# Patient Record
Sex: Female | Born: 1970 | Race: White | Hispanic: No | Smoking: Former smoker
Health system: Southern US, Community
[De-identification: ages and names within clinical notes are randomized; demographics above are authoritative.]

## PROBLEM LIST (undated history)

## (undated) DIAGNOSIS — T7840XA Allergy, unspecified, initial encounter: Secondary | ICD-10-CM

## (undated) DIAGNOSIS — K219 Gastro-esophageal reflux disease without esophagitis: Secondary | ICD-10-CM

## (undated) DIAGNOSIS — Z5189 Encounter for other specified aftercare: Secondary | ICD-10-CM

## (undated) DIAGNOSIS — K635 Polyp of colon: Secondary | ICD-10-CM

## (undated) DIAGNOSIS — E785 Hyperlipidemia, unspecified: Secondary | ICD-10-CM

## (undated) DIAGNOSIS — F419 Anxiety disorder, unspecified: Secondary | ICD-10-CM

## (undated) DIAGNOSIS — E079 Disorder of thyroid, unspecified: Secondary | ICD-10-CM

## (undated) DIAGNOSIS — J45909 Unspecified asthma, uncomplicated: Secondary | ICD-10-CM

## (undated) DIAGNOSIS — M1990 Unspecified osteoarthritis, unspecified site: Secondary | ICD-10-CM

## (undated) DIAGNOSIS — N390 Urinary tract infection, site not specified: Secondary | ICD-10-CM

## (undated) HISTORY — DX: Hyperlipidemia, unspecified: E78.5

## (undated) HISTORY — DX: Encounter for other specified aftercare: Z51.89

## (undated) HISTORY — PX: ABDOMINAL SURGERY: SHX537

## (undated) HISTORY — PX: COLONOSCOPY: SHX174

## (undated) HISTORY — DX: Unspecified osteoarthritis, unspecified site: M19.90

## (undated) HISTORY — PX: TUBAL LIGATION: SHX77

## (undated) HISTORY — DX: Anxiety disorder, unspecified: F41.9

## (undated) HISTORY — PX: BACK SURGERY: SHX140

## (undated) HISTORY — DX: Gastro-esophageal reflux disease without esophagitis: K21.9

## (undated) HISTORY — PX: APPENDECTOMY: SHX54

## (undated) HISTORY — DX: Urinary tract infection, site not specified: N39.0

## (undated) HISTORY — PX: KNEE ARTHROCENTESIS: SUR44

## (undated) HISTORY — PX: CHOLECYSTECTOMY: SHX55

## (undated) HISTORY — DX: Polyp of colon: K63.5

## (undated) HISTORY — PX: HERNIA REPAIR: SHX51

## (undated) HISTORY — DX: Disorder of thyroid, unspecified: E07.9

## (undated) HISTORY — DX: Allergy, unspecified, initial encounter: T78.40XA

## (undated) HISTORY — PX: GANGLION CYST EXCISION: SHX1691

---

## 2000-01-26 ENCOUNTER — Other Ambulatory Visit: Admission: RE | Admit: 2000-01-26 | Discharge: 2000-01-26 | Payer: Self-pay | Admitting: Family Medicine

## 2000-03-22 ENCOUNTER — Ambulatory Visit (HOSPITAL_COMMUNITY): Admission: RE | Admit: 2000-03-22 | Discharge: 2000-03-22 | Payer: Self-pay | Admitting: Family Medicine

## 2000-03-22 ENCOUNTER — Encounter: Payer: Self-pay | Admitting: Family Medicine

## 2002-09-08 ENCOUNTER — Other Ambulatory Visit: Admission: RE | Admit: 2002-09-08 | Discharge: 2002-09-08 | Payer: Self-pay | Admitting: *Deleted

## 2004-11-02 ENCOUNTER — Emergency Department (HOSPITAL_COMMUNITY): Admission: EM | Admit: 2004-11-02 | Discharge: 2004-11-02 | Payer: Self-pay | Admitting: Emergency Medicine

## 2004-11-03 ENCOUNTER — Emergency Department (HOSPITAL_COMMUNITY): Admission: EM | Admit: 2004-11-03 | Discharge: 2004-11-03 | Payer: Self-pay | Admitting: Emergency Medicine

## 2005-01-19 ENCOUNTER — Encounter: Admission: RE | Admit: 2005-01-19 | Discharge: 2005-01-19 | Payer: Self-pay | Admitting: Orthopedic Surgery

## 2005-03-07 ENCOUNTER — Encounter (INDEPENDENT_AMBULATORY_CARE_PROVIDER_SITE_OTHER): Payer: Self-pay | Admitting: *Deleted

## 2005-03-07 ENCOUNTER — Inpatient Hospital Stay (HOSPITAL_COMMUNITY): Admission: RE | Admit: 2005-03-07 | Discharge: 2005-03-11 | Payer: Self-pay | Admitting: Orthopedic Surgery

## 2005-03-21 ENCOUNTER — Inpatient Hospital Stay (HOSPITAL_COMMUNITY): Admission: AD | Admit: 2005-03-21 | Discharge: 2005-03-23 | Payer: Self-pay | Admitting: Orthopedic Surgery

## 2009-05-18 ENCOUNTER — Emergency Department (HOSPITAL_COMMUNITY): Admission: EM | Admit: 2009-05-18 | Discharge: 2009-05-19 | Payer: Self-pay | Admitting: Emergency Medicine

## 2010-08-28 ENCOUNTER — Encounter: Payer: Self-pay | Admitting: Family Medicine

## 2010-12-23 NOTE — Op Note (Signed)
[name], Richardson NO.:  [id number]   MEDICAL RECORD NO.:  [id number]          PATIENT TYPE:  INP   LOCATION:  5040                         FACILITY:  [facility]   PHYSICIAN:  Nelda Severe, MD      DATE OF BIRTH:  12/07/1970   DATE OF PROCEDURE:  03/07/2005  DATE OF DISCHARGE:                                 OPERATIVE REPORT   SURGEON:  Nelda Severe, M.D.   ASSISTANT:  Lynford Citizen R.N.   PREOPERATIVE DIAGNOSIS:  Annular tear, L5-S1 disk.   POSTOPERATIVE DIAGNOSIS:  Annular tear, L5-S1 disk.   OPERATIVE PROCEDURE:  Posterior and anterior L5-S1 fusion using posterior  screws and rods Marcelle Smiling), autogenous bone graft, beta-tricalcium phosphate  (VITOSS), anterior body cage (Signus), left anterior iliac crest bone graft  harvest.   DESCRIPTION OF PROCEDURE:  The patient was placed under general endotracheal  anesthesia.  She was positioned prone on the [place] frame.  Care was taken  to position the upper extremities so as to avoid hyperflexion and abduction  of the shoulders and hyperflexion the elbows.  The arms were padded from  maxilla to hands with foam.  The cubital tunnels were under no pressure.  Prior to positioning the neurological monitoring, the technicians had  attached electrodes to patient's scalp, upper extremities and lower  extremities.  A Foley catheter had been placed in the bladder.   The lumbar area was prepped with DuraPrep and draped in rectangular fashion.  The drapes were secured with Ioban.   A midline incision was made at the lumbosacral level.  Cross-table lateral  radiograph was taken with a Kocher on the L5 spinous process, verifying our  impression of last mobile segment.  Paraspinal muscles were reflected  bilaterally using Cobb elevators and cutting current to a point lateral to  the L5-S1 apophyseal joint and to the base of the transverse process of L5  bilaterally.   Soft tissue was meticulously curetted away from the  lamina of S1 and L5 and  from the apophyseal joints.   Next, pedicle holes were made at S1 bilaterally and L5 bilaterally.  This  was done using the usual technique of removing the base of the superior  articular process, identifying the posterior end of the pedicle, perforating  it with an awl, and using a 3.5-mm drill bit to make a hole through the  pedicle into the vertebral body.  Each hole was carefully palpated  circumferentially to make sure it was intact and sounded for depths and the  depths recorded.  The hole was then injected with FloSeal and a radiopaque  marker placed and sealed with bone wax.  Cross-table lateral radiograph  showed satisfactory position of pins, except for the left S1, where the pin  appeared to be immediately subjacent to the endplate.  Therefore, at the  time of screw insertion, the screw was directed slightly distally.   We then harvested local bone graft by removing shavings of bone with a sharp  osteotome from the lamina of L5 and also excising most of the inferior  articular process  of L5.  We then removed the articular surfaces with a  sharp osteotome from the S1 superior articular process.   An 18-gauge needle was then inserted into the left iliac crest and  approximately 10-12 mL of bone marrow aspirated.  This was mixed with 10 mL  of beta-tricalcium phosphate (VITOSS).  The VITOSS and bone marrow were then  mixed with the bone graft which had been removed locally.  The lamina of S1  was also decorticated with a sharp osteotome.   Next, screws were inserted bilaterally at S1 and L5.  These were 6.5-mm-  diameter screws.  These lengths were placed on our depth measurements.   We then packed our graft mixed with VITOSS and bone marrow into the area of  the excised facet joints bilaterally and between the lamina of L5 and S1  bilaterally.  The 45-mm rods, pre-contoured, were then attached to the  screws and the attachments torqued after  compressing the construct.  Cross-  table lateral radiographs showed satisfactory position of the implants.   The wound was then closed using continuous #1 Vicryl in the lumbar fascia,  interrupted and continuous 2-0 Vicryl in the subcutaneous tissue over a 1/8-  inch Hemovac drain and 4-0 subcuticular Vicryl (undyed) through the skin  incision.  The incision unfortunately had to be made directly through a  fairly large and elaborate tattoo on the lumbar area.  Every effort was made  to reapproximate skin edges exactly.  The skin edges were reinforced with  Steri-Strips and an antibiotic ointment dressing applied and secured with  OpSite.  Prior to applying the dressing, the 1/8-inch Hemovac drain was  secured with 2-0 nylon suture in basket-weave fashion.  For application of  the dressing, the patient was then placed supine on a Jackson table which  had been moved into the room and the [place] frame removed.  The patient was  positioned supine with her arms abducted on arm boards and a pillow under  the knees and pillow padding the heels.   The abdomen was prepped with for DuraPrep and draped in a square fashion and  the drapes secured with Ioban.  The prepping included the left anterior  iliac crest area.   Prior to starting the fusion, we harvested a cancellous graft from the left  anterior iliac crest.  The incision was made about 1.5 cm distal to the  crest and the skin pulled proximally.  Incision was carried down onto the  top of the iliac crest and the fascia mobilized medially and laterally to  expose the top of the crest.  An osteotome was then used to shave off some  of the cortical bone and then a small osteotome used to fenestrate what  remained of the superior cortex.  A curette was then used to remove  cancellous bone from between the outer and inner tables.  When enough bone  had been harvested, Gelfoam was packed into the bony defect.   Next, we started the anterior  operation.  A transverse incision was made in  the left lower quadrant about halfway between the umbilicus and symphysis  pubis to the left side.  The underlying rectus sheath was incised  transversely.  The lateral and medial edges of the rectus were identified.  The retroperitoneal space was entered by bluntly dissecting through  preperitoneal fat into the retroperitoneum with the rectus abdominis muscle  retracted to the right side.  Blunt dissection was carried out.  There were  some scarring in the preperitoneal area.  One tear occurred in the  peritoneum which was immediately closed.  The retroperitoneal fat was  bluntly dissected off the common iliac vessels and the ureter retracted with  the peritoneal sac to the right side.  The sacral promontory could be  palpated.  There was a fair amount of scarring overlying the sacral  promontory.  Retractors were provisionally placed and the presacral scar  tissues dissected sharply to the annulus of the disk.  Once in the plane of  the annulus, mobilization of the common iliac vessels was carried out  bilaterally.  Brau retractors on a Thompson frame were used to retract the  iliac vessels.  The peritoneal contents were retracted superiorly using a  right-angle Thompson retractor and a malleable handheld retractor used to  retract the pelvic contents anteriorly away from the front of the sacrum.   The annulus was then incised in rectangular fashion.  A Cobb elevator was  then used to elevate the annulus and nucleus off the superior endplate of S1  and inferior endplate of L5 and the circumferential fibers laterally lysed  with the Cobb elevator.  Approximately 50% to 60% of the disk was removed in  1 piece.   We then vigorously curetted the endplates to remove the endplate cartilage  and curetted the disk posteriorly and laterally to remove more disk tissue.  Eventually, a broad chisel was used to remove a sliver of end plate off the   superior aspect of S1.  This technique was also used on the inferior aspect  of L5.  Eventually, I felt that we had prepared the endplates adequately  insofar as there was no endplate cartilage in the area of the fusion bed and  there was bleeding cancellus bone for contact with the graft below and  above.   After use of a trial implant, a 12-mm, 10-degree-angled Signus anterior  interbody implant was chosen.  Its chambers were filled with autograft prior  to impacting.  It  was then impacted into place and fit well.  A 30 x 6.5-mm  titanium Synthes screw with washer was then inserted into the upper sacrum  so that the washer impinged against the interbody implant to prevent kick-  out.  This is a so-called doorstop screw.   The retractors were then carefully removed, on at a time to make sure that  no undetected bleeding was obscured.  There was no significant bleeding as they were removed.   A cross-table lateral radiograph was taken which was completely  interpretable.  Subsequently, another one was taken, and apparently better  quality could not be obtained.  However, I could see the interbody implant  and the titanium screw.  Another radiograph will be taken postoperatively  once the patient is more mobile.   The rectus sheath was then closed using continuous 0 Vicryl suture.  The  subcutaneous tissue was closed using 2-0 Vicryl in continuous fashion.  The  skin was closed using a subcuticular 3-0 undyed Vicryl suture.  The muscles  fascia, which had not really been detached from medial or lateral wall of  the pelvis, was reapposed over the fenestration in the upper iliac crest  using interrupted 2-0 Vicryl suture.  The subcutaneous tissue was closed  using 2-0 Vicryl and the skin using a subcuticular continuous undyed 3-0  Vicryl suture.  The skin edges of the bone graft harvest site and the  abdomen were reinforced using Steri-Strips.  Antibiotic  dressings were  applied to both  and secured with OpSite.   The patient's blood loss was estimated at 150 mL.  The patient was stable  throughout the procedure.   Not mentioned above is the fact that prior to attaching the rods to the  posterior pedicle screws, each screw was tested and EMGs recorded distally.  The level for stimulation at each screw was above the threshold level for  every screw, indicating that contact between the screw and nerve root was  unlikely.   There were no intraoperative complications.  The sponge and needle counts  were correct.  At the time of dictation, the patient has not awakened  sufficiently to perform a neurologic examination, so none is reported here.  She has not yet been transported to the recovery room.      Nelda Severe, MD  Electronically Signed     MT/MEDQ  D:  03/07/2005  T:  03/08/2005  Job:  [id number]

## 2010-12-23 NOTE — Discharge Summary (Signed)
NAMEDASIAH, HOOLEY NO.:  [id number]   MEDICAL RECORD NO.:  [id number]          PATIENT TYPE:  INP   LOCATION:  5040                         FACILITY:  [facility]   PHYSICIAN:  Nelda Severe, MD      DATE OF BIRTH:  Oct 06, 1970   DATE OF ADMISSION:  03/07/2005  DATE OF DISCHARGE:  03/11/2005                                 DISCHARGE SUMMARY   This woman was admitted for management of diskogenic back pain secondary to  an annular tear at L5-S1. On the day of admission she was taken to the  operating room where anterior and posterior L5-S1 fusion was performed.  There were no complications. Postoperatively the patient mobilized  satisfactorily. The wounds were stable. She is ambulatory. Postoperatively  the patient ambulated well with a walker. She has met her physical therapy  goals. Her dressings are dry.   At the present time she is being discharged. She is being given  prescriptions for Percocet one to two q.6h. p.r.n., 75 tablets, and Colace  100 mg b.i.d. three weeks supply. She is being discharged with a three-in-  one chair and a front-wheeled walker. She is to come back to see me in the  office in four weeks. She is to call me for any problems, especially wound  drainage or fever.   FINAL DIAGNOSIS:  Lumbar disk degeneration with annular tear.   CONDITION ON DISCHARGE:  Ambulatory and stable.   DISCHARGE ARRANGEMENTS:  See above.      Nelda Severe, MD  Electronically Signed     MT/MEDQ  D:  03/11/2005  T:  03/12/2005  Job:  [id number]

## 2010-12-23 NOTE — Discharge Summary (Signed)
NAMEASTOU, Mikayla Richardson NO.:  [id number]   MEDICAL RECORD NO.:  [id number]          PATIENT TYPE:  INP   LOCATION:  5039                         FACILITY:  [facility]   PHYSICIAN:  Nelda Severe, MD      DATE OF BIRTH:  June 06, 1971   DATE OF ADMISSION:  03/21/2005  DATE OF DISCHARGE:  03/23/2005                                 DISCHARGE SUMMARY   HOSPITAL COURSE:  This woman had presented to my office on the day of  admission with the onset of very severe left lower quadrant abdominal pain.  This pain had onset suddenly the evening before.  She was admitted to the  hospital for investigation of the pain.  She was 2 weeks status post  anterior and posterior lumbar fusion which was uncomplicated.  Her abdominal  examination was benign, but she had severe left lower quadrant pain which  appeared to localize more in the region of her incision.  However, there was  no erythema or drainage from the incision.   At the time of admission, she underwent abdominal and pelvic CT with  contrast.  We consulted Dr. Johna Sheriff, general surgeon, and he felt that  there was nothing intraperitoneal that was going on.  The best that he or I  could come up with was that she had some type of event in her incision,  although there was no obvious hematoma, swelling or discharge.   She has remained by in large in bed.  She was on PCA morphine pump.  The  pain is gradually resolving, although is incompletely resolved at the  present time.  She has not shown any systemic symptoms.  She has been  afebrile.  As noted, the pain is decreasing.  Dr. Johna Sheriff saw her today and  signed off on the case for the time-being.   At this time, we are discharging her home.  I have given her a prescription  for Vicodin one to two q.6 h. p.r.n. for pain.  She is to keep her  previously made appointment in the office on April 03, 2005.  She is to  call the office if there are any recurrent problems or any other  problems  for that matter.  She will continue to abide by the back precautions which  she was instructed in prior to her original discharge a little over 2 weeks  ago.   FINAL DIAGNOSIS:  Status post lumbosacral fusion (anterior and posterior)  with abdominal pain, etiology uncertain.   CONDITION ON DISCHARGE:  Pain improving, stable.   DISCHARGE ARRANGEMENTS:  Office followup on April 03, 2005.   DISCHARGE MEDICATIONS:  She has been given a prescription for Vicodin and is  to resume the other medications which she takes including Levoxyl,  albuterol, etc, which she has at home.      Nelda Severe, MD  Electronically Signed     MT/MEDQ  D:  03/23/2005  T:  03/24/2005  Job:  [id number]

## 2012-08-04 ENCOUNTER — Emergency Department (HOSPITAL_COMMUNITY)
Admission: EM | Admit: 2012-08-04 | Discharge: 2012-08-05 | Disposition: A | Discharge status: Home / Self Care | Payer: BC Managed Care – PPO | Attending: Emergency Medicine | Admitting: Emergency Medicine

## 2012-08-04 ENCOUNTER — Emergency Department (HOSPITAL_COMMUNITY): Payer: BC Managed Care – PPO

## 2012-08-04 ENCOUNTER — Encounter (HOSPITAL_COMMUNITY): Payer: Self-pay | Admitting: *Deleted

## 2012-08-04 DIAGNOSIS — F17200 Nicotine dependence, unspecified, uncomplicated: Secondary | ICD-10-CM | POA: Insufficient documentation

## 2012-08-04 DIAGNOSIS — Z79899 Other long term (current) drug therapy: Secondary | ICD-10-CM | POA: Insufficient documentation

## 2012-08-04 DIAGNOSIS — R0789 Other chest pain: Secondary | ICD-10-CM | POA: Insufficient documentation

## 2012-08-04 DIAGNOSIS — J45909 Unspecified asthma, uncomplicated: Secondary | ICD-10-CM | POA: Insufficient documentation

## 2012-08-04 HISTORY — DX: Unspecified asthma, uncomplicated: J45.909

## 2012-08-04 LAB — CBC WITH DIFFERENTIAL/PLATELET
Basophils Absolute: 0.1 10*3/uL (ref 0.0–0.1)
Basophils Relative: 1 % (ref 0–1)
HCT: 38.9 % (ref 36.0–46.0)
Hemoglobin: 13 g/dL (ref 12.0–15.0)
Lymphs Abs: 2.5 10*3/uL (ref 0.7–4.0)
MCHC: 33.4 g/dL (ref 30.0–36.0)
Neutro Abs: 4.4 10*3/uL (ref 1.7–7.7)
Platelets: 288 10*3/uL (ref 150–400)
RBC: 4.4 MIL/uL (ref 3.87–5.11)

## 2012-08-04 LAB — TROPONIN I: Troponin I: <0.3 ng/mL (ref <0.30)

## 2012-08-04 LAB — BASIC METABOLIC PANEL
CO2: 27 mEq/L (ref 19–32)
Creatinine, Ser: 0.75 mg/dL (ref 0.50–1.10)
GFR calc Af Amer: >90 mL/min (ref >90)

## 2012-08-04 LAB — POCT I-STAT TROPONIN I: Troponin i, poc: 0 ng/mL (ref 0.00–0.08)

## 2012-08-04 MED ORDER — IBUPROFEN 800 MG PO TABS: 800.0000 mg | ORAL_TABLET | Freq: Three times a day (TID) | ORAL | Status: DC

## 2012-08-04 NOTE — ED Provider Notes (Signed)
History     CSN: [id number]  Arrival date & time 08/04/12  1946   First MD Initiated Contact with Patient 08/04/12 2153      Chief Complaint  Patient presents with  . Chest Pain    (Consider location/radiation/quality/duration/timing/severity/associated sxs/prior treatment) HPI History provided by pt.   Pt has had intermittent, non-radiating, non-pleuritic, pressure-like pain at LSB for the past 3 nights.  Occurs when she turns her head to the right or ranges her LUE.  No associated sx.  Has not taken anything for pain.  Believes she may have pulled a muscle.  No known trauma. Smokes cigarettes but otherwise no RF for ACS.   Past Medical History  Diagnosis Date  . Asthma     Past Surgical History  Procedure Date  . Back surgery   . Cholecystectomy   . Abdominal surgery   . Hernia repair     History reviewed. No pertinent family history.  History  Substance Use Topics  . Smoking status: Current Every Day Smoker    Types: Cigarettes  . Smokeless tobacco: Not on file  . Alcohol Use: Yes     Comment: rarely    OB History    Grav Para Term Preterm Abortions TAB SAB Ect Mult Living                  Review of Systems  All other systems reviewed and are negative.    Allergies  Review of patient's allergies indicates no known allergies.  Home Medications   Current Outpatient Rx  Name  Route  Sig  Dispense  Refill  . ALBUTEROL SULFATE HFA 108 (90 BASE) MCG/ACT IN AERS   Inhalation   Inhale 2 puffs into the lungs every 6 (six) hours as needed. Wheezing/sob         . FLUTICASONE-SALMETEROL 250-50 MCG/DOSE IN AEPB   Inhalation   Inhale 1 puff into the lungs every 12 (twelve) hours.         Marland Kitchen LEVOTHYROXINE SODIUM 88 MCG PO TABS   Oral   Take 88 mcg by mouth every morning.         Marland Kitchen MONTELUKAST SODIUM 10 MG PO TABS   Oral   Take 10 mg by mouth at bedtime.           BP 130/85  Pulse 93  Temp 97.1 F (36.2 C) (Oral)  Resp 18  SpO2 100%  LMP  06/04/2012  Physical Exam  Nursing note and vitals reviewed. Constitutional: She is oriented to person, place, and time. She appears well-developed and well-nourished. No distress.  HENT:  Head: Normocephalic and atraumatic.  Eyes:       Normal appearance  Neck: Normal range of motion.  Cardiovascular: Normal rate and regular rhythm.   Pulmonary/Chest: Effort normal and breath sounds normal. No respiratory distress.       Tenderness at LSB and pain aggravated by abduction of left shoulder.   Musculoskeletal: Normal range of motion.       No LE edema/ttp  Neurological: She is alert and oriented to person, place, and time.  Skin: Skin is warm and dry. No rash noted.  Psychiatric: She has a normal mood and affect. Her behavior is normal.    ED Course  Procedures (including critical care time)   Date: 08/04/2012  Rate: 81  Rhythm: normal sinus rhythm  QRS Axis: normal  Intervals: normal  ST/T Wave abnormalities: normal  Conduction Disutrbances:none  Narrative Interpretation:   Old  EKG Reviewed: unchanged   Labs Reviewed  CBC WITH DIFFERENTIAL - Abnormal; Notable for the following:    Eosinophils Relative 7 (*)     All other components within normal limits  BASIC METABOLIC PANEL  TROPONIN I  POCT I-STAT TROPONIN I  LAB REPORT - SCANNED   Dg Chest 2 View  08/04/2012  *RADIOLOGY REPORT*  Clinical Data: Chest pain.  CHEST - 2 VIEW  Comparison: 03/09/2005.  Findings: Trachea is midline.  Heart size normal.  Lungs are clear. No pleural fluid.  IMPRESSION: No acute findings.   Original Report Authenticated By: Leanna Battles, M.D.      1. Musculoskeletal chest pain       MDM  41yo F smoker, otherwise healthy, presents w/ 3 days of intermittent CP that occurs with head rotation and ROM of LUE.  Doubt ACS; pain atypical, low risk, EKG non-ischemic and serial troponins neg.  Doubt PE; pain non-pleuritic, no signs of DVT and HR/RR w/in nml range.  CXR neg.  S/sx most  consistent w/ muscle strain or costochondritis.  Pt has been reassured.  I recommended ice and NSAID.  Dr. Rhunette Croft in agreement w/ A&P.  Return precautions discussed.        Otilio Miu, PA-C 08/05/12 [phone]

## 2012-08-04 NOTE — ED Notes (Signed)
Pt c/o L/central CP starting on Thurs afternoon-evening. Pt describes pain as squeezing and intermittent. Pt states pain is persistent, describes back pain and tightness which she says is unchanged from normal discomfort related to chronic asthma and back pain. Pt is PWD, not diaphoretic but describes intermittent clamminess. Pt in no obvious distress at this time.

## 2012-08-04 NOTE — ED Notes (Signed)
Per Florentina Addison PA  Pt doesn't need an IV

## 2012-08-09 NOTE — ED Provider Notes (Signed)
Medical screening examination/treatment/procedure(s) were conducted as a shared visit with non-physician practitioner(s) and myself.  I personally evaluated the patient during the encounter.  Atypical chest pain that is reproducible in a young woman with CAD risk factor - smoking. She is young, and menstruating, which nullifies even the single risk factor she has. Trops x 2 ordered as the ACS r/o.  Derwood Kaplan, MD 08/09/12 [phone]

## 2012-09-04 DIAGNOSIS — J45909 Unspecified asthma, uncomplicated: Secondary | ICD-10-CM | POA: Insufficient documentation

## 2012-09-04 DIAGNOSIS — J4530 Mild persistent asthma, uncomplicated: Secondary | ICD-10-CM | POA: Insufficient documentation

## 2012-09-26 DIAGNOSIS — J301 Allergic rhinitis due to pollen: Secondary | ICD-10-CM | POA: Insufficient documentation

## 2012-09-26 DIAGNOSIS — J309 Allergic rhinitis, unspecified: Secondary | ICD-10-CM | POA: Insufficient documentation

## 2013-01-20 DIAGNOSIS — Z800 Family history of malignant neoplasm of digestive organs: Secondary | ICD-10-CM | POA: Insufficient documentation

## 2013-01-20 DIAGNOSIS — F17200 Nicotine dependence, unspecified, uncomplicated: Secondary | ICD-10-CM | POA: Insufficient documentation

## 2013-01-21 DIAGNOSIS — E785 Hyperlipidemia, unspecified: Secondary | ICD-10-CM | POA: Insufficient documentation

## 2013-07-24 DIAGNOSIS — E039 Hypothyroidism, unspecified: Secondary | ICD-10-CM | POA: Insufficient documentation

## 2017-06-02 ENCOUNTER — Emergency Department (HOSPITAL_COMMUNITY): Payer: BLUE CROSS/BLUE SHIELD

## 2017-06-02 ENCOUNTER — Encounter (HOSPITAL_COMMUNITY): Payer: Self-pay | Admitting: Emergency Medicine

## 2017-06-02 ENCOUNTER — Emergency Department (HOSPITAL_COMMUNITY)
Admission: EM | Admit: 2017-06-02 | Discharge: 2017-06-02 | Disposition: A | Discharge status: Home / Self Care | Payer: BLUE CROSS/BLUE SHIELD | Attending: Emergency Medicine | Admitting: Emergency Medicine

## 2017-06-02 DIAGNOSIS — F17210 Nicotine dependence, cigarettes, uncomplicated: Secondary | ICD-10-CM | POA: Diagnosis not present

## 2017-06-02 DIAGNOSIS — M25572 Pain in left ankle and joints of left foot: Secondary | ICD-10-CM | POA: Diagnosis not present

## 2017-06-02 DIAGNOSIS — J45909 Unspecified asthma, uncomplicated: Secondary | ICD-10-CM | POA: Diagnosis not present

## 2017-06-02 DIAGNOSIS — Z79899 Other long term (current) drug therapy: Secondary | ICD-10-CM | POA: Insufficient documentation

## 2017-06-02 MED ORDER — IBUPROFEN 800 MG PO TABS: 800.0000 mg | ORAL_TABLET | Freq: Three times a day (TID) | ORAL | 0 refills | Status: DC | PRN

## 2017-06-02 NOTE — ED Provider Notes (Signed)
[facility] DEPT Provider Note   CSN: [id number] Arrival date & time: 06/02/17  1453     History   Chief Complaint Chief Complaint  Patient presents with  . Ankle Pain    HPI Mikayla Richardson is a 46 y.o. female.  The history is provided by the patient and medical records. No language interpreter was used.  Ankle Pain   Pertinent negatives include no numbness.   Mikayla Richardson is a 46 y.o. female  with a PMH of asthma who presents to the Emergency Department complaining of persistent left medial ankle pain x 4 days. Patient states that she was walking her dog when she rolled her ankle causing acute onset of pain.  She has been taking ibuprofen and icing the area with little improvement.  She denies numbness, tingling.  She has been able to ambulate without assistance since rolling her ankle, however sometimes does walk with a limp. No hx of prior injuries to the left lower extremity. She assumed her pain would be improving by now, but when she awoke this morning, she noted no improvement, therefore came to ED for evaluation.   Past Medical History:  Diagnosis Date  . Asthma     There are no active problems to display for this patient.   Past Surgical History:  Procedure Laterality Date  . ABDOMINAL SURGERY    . BACK SURGERY    . CHOLECYSTECTOMY    . HERNIA REPAIR      OB History    No data available       Home Medications    Prior to Admission medications   Medication Sig Start Date End Date Taking? Authorizing Provider  albuterol (PROVENTIL HFA;VENTOLIN HFA) 108 (90 BASE) MCG/ACT inhaler Inhale 2 puffs into the lungs every 6 (six) hours as needed. Wheezing/sob    [provider]  Fluticasone-Salmeterol (ADVAIR) 250-50 MCG/DOSE AEPB Inhale 1 puff into the lungs every 12 (twelve) hours.    [provider]  ibuprofen (ADVIL,MOTRIN) 800 MG tablet Take 1 tablet (800 mg total) by mouth every 8 (eight) hours as needed for mild  pain or moderate pain. 06/02/17   Jaciel Diem, Ozella Almond, PA-C  levothyroxine (SYNTHROID, LEVOTHROID) 88 MCG tablet Take 88 mcg by mouth every morning.    [provider]  montelukast (SINGULAIR) 10 MG tablet Take 10 mg by mouth at bedtime.    [provider]    Family History No family history on file.  Social History Social History  Substance Use Topics  . Smoking status: Current Every Day Smoker    Types: Cigarettes  . Smokeless tobacco: Not on file  . Alcohol use Yes     Comment: rarely     Allergies   Patient has no known allergies.   Review of Systems Review of Systems  Musculoskeletal: Positive for arthralgias.  Skin: Negative for color change and wound.  Neurological: Negative for weakness and numbness.     Physical Exam Updated Vital Signs BP (!) 124/93 (BP Location: Right Arm)   Pulse 84   Temp 98.1 F (36.7 C) (Oral)   Resp 18   LMP 06/04/2012   SpO2 97%   Physical Exam  Constitutional: She appears well-developed and well-nourished. No distress.  HENT:  Head: Normocephalic and atraumatic.  Neck: Neck supple.  Cardiovascular: Normal rate, regular rhythm and normal heart sounds.   No murmur heard. Pulmonary/Chest: Effort normal and breath sounds normal. No respiratory distress. She has no wheezes. She  has no rales.  Musculoskeletal:  Left ankle with tenderness to palpation just above medial malleolus with mild associated swelling. Full ROM. No erythema, ecchymosis, or deformity appreciated. No break in skin. No pain to fifth metatarsal area or navicular region. Achilles intact. Good pedal pulse and cap refill of toes.Sensation grossly intact.  Neurological: She is alert.  Skin: Skin is warm and dry.  Nursing note and vitals reviewed.    ED Treatments / Results  Labs (all labs ordered are listed, but only abnormal results are displayed) Labs Reviewed - No data to display  EKG  EKG Interpretation None       Radiology Dg Ankle  Complete Left  Result Date: 06/02/2017 CLINICAL DATA:  Twisted left ankle x 5 days ago; pain on medial Malleolus; No previous injury EXAM: LEFT ANKLE COMPLETE - 3+ VIEW COMPARISON:  None. FINDINGS: Osseous alignment is normal. No fracture line or displaced fracture fragment. Ankle mortise is symmetric. Visualized portions of the hindfoot and midfoot appear intact and normally aligned. Adjacent soft tissues are unremarkable. IMPRESSION: Negative. Electronically Signed   By: Franki Cabot M.D.   On: 06/02/2017 16:09    Procedures Procedures (including critical care time)  Medications Ordered in ED Medications - No data to display   Initial Impression / Assessment and Plan / ED Course  I have reviewed the triage vital signs and the nursing notes.  Pertinent labs & imaging results that were available during my care of the patient were reviewed by me and considered in my medical decision making (see chart for details).    Mikayla Richardson is a 46 y.o. female who presents to ED for left ankle pain after rolling ankle 4 days ago. NVI on exam. No open wounds. X-ray negative. Symptomatic home care instructions discussed. Crutches and ace wrap provided in ED. Ortho follow up if no improvement with RICE / NSAID in 1 week. Reasons to return to ER discussed and all questions answered.   Final Clinical Impressions(s) / ED Diagnoses   Final diagnoses:  Acute left ankle pain    New Prescriptions New Prescriptions   IBUPROFEN (ADVIL,MOTRIN) 800 MG TABLET    Take 1 tablet (800 mg total) by mouth every 8 (eight) hours as needed for mild pain or moderate pain.     Illyanna Petillo, Ozella Almond, PA-C 06/02/17 1636    Charlesetta Shanks, MD 06/02/17 [phone]

## 2017-06-02 NOTE — Discharge Instructions (Signed)
Ibuprofen as needed for pain. Ice and alleviate for additional pain relief. If your symptoms persist without improvement in one week, please follow up with the orthopedist listed. You will need to call them to schedule an appointment.     TREATMENT  Rest, ice, elevation, and compression are the basic modes of treatment.    HOME CARE INSTRUCTIONS  Apply ice to the sore area for 15 to 20 minutes, 3 to 4 times per day. Do this while you are awake for the first 2 days. This can be stopped when the swelling goes away. Put the ice in a plastic bag and place a towel between the bag of ice and your skin.  Keep your leg elevated when possible to lessen swelling.  You may take off your ankle stabilizer at night and to take a shower or bath. Wiggle your toes several times per day if you are able.  ACTIVITY:            - Weight bearing as tolerated - if you can do it, do it. If it hurts, stop.             - Exercises should be limited to pain free range of motion            - Can start mobilization by tracing the alphabet with your foot in the air.       SEEK MEDICAL CARE IF:  You have an increase in bruising, swelling, or pain.  Your toes feel cold.  Pain relief is not achieved with medications.  EMERGENCY:: Your toes are numb or blue or you have severe pain.   MAKE SURE YOU:  Understand these instructions.  Will watch your condition.  Will get help right away if you are not doing well or get worse

## 2017-06-02 NOTE — ED Triage Notes (Signed)
Per pt, states she thinks she twisted her left ankle last Tuesday-swelling and pain

## 2017-11-29 ENCOUNTER — Encounter: Payer: Self-pay | Admitting: Nurse Practitioner

## 2017-12-17 ENCOUNTER — Ambulatory Visit (INDEPENDENT_AMBULATORY_CARE_PROVIDER_SITE_OTHER): Payer: BLUE CROSS/BLUE SHIELD | Admitting: Nurse Practitioner

## 2017-12-17 ENCOUNTER — Encounter: Payer: Self-pay | Admitting: Nurse Practitioner

## 2017-12-17 VITALS — BP 104/60 | HR 82 | Ht 64.0 in | Wt 142.5 lb

## 2017-12-17 DIAGNOSIS — R634 Abnormal weight loss: Secondary | ICD-10-CM

## 2017-12-17 DIAGNOSIS — R1310 Dysphagia, unspecified: Secondary | ICD-10-CM | POA: Diagnosis not present

## 2017-12-17 DIAGNOSIS — Z800 Family history of malignant neoplasm of digestive organs: Secondary | ICD-10-CM | POA: Diagnosis not present

## 2017-12-17 DIAGNOSIS — K219 Gastro-esophageal reflux disease without esophagitis: Secondary | ICD-10-CM | POA: Diagnosis not present

## 2017-12-17 NOTE — Progress Notes (Signed)
Reviewed and agree with management plan.  Jaymar Loeber T. Jacques Fife, MD FACG 

## 2017-12-17 NOTE — Patient Instructions (Signed)
If you are age 49 or older, your body mass index should be between 23-30. Your Body mass index is 24.46 kg/m. If this is out of the aforementioned range listed, please consider follow up with your Primary Care Provider.  If you are age 70 or younger, your body mass index should be between 19-25. Your Body mass index is 24.46 kg/m. If this is out of the aformentioned range listed, please consider follow up with your Primary Care Provider.   You have been scheduled for an endoscopy. Please follow written instructions given to you at your visit today. If you use inhalers (even only as needed), please bring them with you on the day of your procedure. Your physician has requested that you go to www.startemmi.com and enter the access code given to you at your visit today. This web site gives a general overview about your procedure. However, you should still follow specific instructions given to you by our office regarding your preparation for the procedure.  Continue Omeprazole.  Thank you for choosing me and [facility].   Mikayla Savoy, NP

## 2017-12-17 NOTE — Progress Notes (Signed)
Chief Complaint: dysphagia  Referring Provider:   Silvano Rusk, ANP     ASSESSMENT AND PLAN;   4.  47 yo female recently relocated back to [place] where she is from.  Patient is referred by PCP for evaluation of dysphagia which started about 5-6 weeks ago and described as progressive.  Doubt malignant process.  Rule out esophageal rings stricture. Possibly dysmotility though abrupt onset seems atypical.  -Given the weight loss will proceed with EGD with possible dilation. The risks and benefits of EGD were discussed and the patient agrees to proceed.   2.  Strong family history of GI malignancies. One brother deceased from gastric cancer at age 63.  Another brother had cancer at age 86.  Patient's father had colon cancer at age 43 -Patient is up-to-date on colonoscopy, last one March 2018.  Will request records.  She had an endoscopy done at the same time for her family history  3.  Personal history: Polyps.  Last colonoscopy done at time of EGD in [place] March 2018. -Patient has relocated to [place].  We can take over her surveillance colonoscopies from here.  Records requested  5. Tobacco abuse,  Given tobacco cessation information  6. GERD, asymptomatic on omeprazole 40 mg daily which she has taken for 40 years  7.  Thyroid nodules  HPI:    She is a 47 year old female originally from [place] but moved to [place] for work.  She has recently relocated back home to [place].  She is referred by PCP for evaluation of dysphagia.  Patient gives a 4-year history of GERD.  She is basically asymptomatic on daily omeprazole.  She also has a strong family history of gastrointestinal malignancies and father and 2 brothers as mentioned above.  Patien gives a 5 to 6-week history of dysphagia.  Feels like almost any solid gets stuck in her esophagus.  She tends to lean forward as a maneuver to get the food down.  She also coughs/strangles sometimes drinking  fluids.  These episodes of dysphasia are occurring at least once a day.  She was trying to lose weight a few weeks ago and lost several pounds then began losing it unintentionally which apparently called her PCPs attention patient says she is lost about 17 pounds recently.  She has no lower GI complaints, bowel movements are normal, no blood in stool.  Past Medical History:  Diagnosis Date  . Anxiety   . Asthma   . Colon polyps   . Thyroid disease   . UTI (urinary tract infection)      Past Surgical History:  Procedure Laterality Date  . ABDOMINAL SURGERY    . APPENDECTOMY    . BACK SURGERY    . CHOLECYSTECTOMY    . HERNIA REPAIR     Family History  Problem Relation Age of Onset  . Heart attack Mother   . Hypertension Mother   . Colon cancer Father   . Diabetes Father   . Asthma Sister   . Stomach cancer Brother   . Stomach cancer Brother    Social History   Tobacco Use  . Smoking status: Current Every Day Smoker    Types: Cigarettes  . Smokeless tobacco: Never Used  . Tobacco comment: 6-7 cigarrettes a day  Substance Use Topics  . Alcohol use: Yes    Comment: rarely  . Drug use: No   Current Outpatient Medications  Medication Sig Dispense Refill  . albuterol (PROVENTIL HFA;VENTOLIN HFA) 108 (  90 BASE) MCG/ACT inhaler Inhale 2 puffs into the lungs every 6 (six) hours as needed. Wheezing/sob    . Fluticasone-Salmeterol (ADVAIR) 250-50 MCG/DOSE AEPB Inhale 1 puff into the lungs every 12 (twelve) hours.    Marland Kitchen ibuprofen (ADVIL,MOTRIN) 800 MG tablet Take 1 tablet (800 mg total) by mouth every 8 (eight) hours as needed for mild pain or moderate pain. 21 tablet 0  . levothyroxine (SYNTHROID, LEVOTHROID) 88 MCG tablet Take 88 mcg by mouth every morning.    . montelukast (SINGULAIR) 10 MG tablet Take 10 mg by mouth at bedtime.    Marland Kitchen omeprazole (PRILOSEC) 40 MG capsule Take 40 mg by mouth daily.     No current facility-administered medications for this visit.    No Known  Allergies   Review of Systems: Positive for allergy, sinus trouble, anxiety, cough, fatigue, shortness of breath, sleeping problems, sore throat, swollen lymph glands, excessive thirst.  All other systems reviewed and negative except where noted in HPI.    Physical Exam:    Wt Readings from Last 3 Encounters:  12/17/17 142 lb 8 oz (64.6 kg)    BP 104/60   Pulse 82   Ht 5\' 4"  (1.626 m)   Wt 142 lb 8 oz (64.6 kg)   LMP 06/04/2012   BMI 24.46 kg/m  Constitutional:  Well-developed, white female in no acute distress. Psychiatric: Normal mood and affect. Behavior is normal. EENT: Pupils normal.  Conjunctivae are normal. No scleral icterus. Neck supple.  Cardiovascular: Normal rate, regular rhythm. No edema Pulmonary/chest: Effort normal and breath sounds normal. No wheezing, rales or rhonchi. Abdominal: Soft, nondistended. Nontender. Bowel sounds active throughout. There are no masses palpable. No hepatomegaly. Neurological: Alert and oriented to person place and time. Skin: Skin is warm and dry. No rashes noted.  Tye Savoy, NP  12/17/2017, 9:08 AM  Cc:  Silvano Rusk, ANP

## 2017-12-20 ENCOUNTER — Other Ambulatory Visit: Payer: Self-pay

## 2017-12-20 ENCOUNTER — Encounter: Payer: Self-pay | Admitting: Gastroenterology

## 2017-12-20 ENCOUNTER — Ambulatory Visit (AMBULATORY_SURGERY_CENTER): Payer: BLUE CROSS/BLUE SHIELD | Admitting: Gastroenterology

## 2017-12-20 VITALS — BP 102/64 | HR 80 | Temp 97.5°F | Resp 13 | Ht 64.0 in | Wt 142.0 lb

## 2017-12-20 DIAGNOSIS — R1310 Dysphagia, unspecified: Secondary | ICD-10-CM | POA: Diagnosis not present

## 2017-12-20 DIAGNOSIS — K319 Disease of stomach and duodenum, unspecified: Secondary | ICD-10-CM

## 2017-12-20 DIAGNOSIS — R634 Abnormal weight loss: Secondary | ICD-10-CM | POA: Diagnosis not present

## 2017-12-20 MED ORDER — OMEPRAZOLE 40 MG PO CPDR: 40.0000 mg | DELAYED_RELEASE_CAPSULE | Freq: Two times a day (BID) | ORAL | 3 refills | Status: DC

## 2017-12-20 MED ORDER — SODIUM CHLORIDE 0.9 % IV SOLN: 500.0000 mL | Freq: Once | INTRAVENOUS | Status: DC

## 2017-12-20 NOTE — Op Note (Signed)
[facility] Patient Name: Mikayla Richardson Procedure Date: 12/20/2017 8:01 AM MRN: [id number] Endoscopist: Ladene Artist , MD Age: 47 Referring MD:  Date of Birth: 08-02-1971 Gender: Female Account #: [id number] Procedure:                Upper GI endoscopy Indications:              Dysphagia, Weight loss, Cough, Throat tightness Medicines:                Monitored Anesthesia Care Procedure:                Pre-Anesthesia Assessment:                           - Prior to the procedure, a History and Physical                            was performed, and patient medications and                            allergies were reviewed. The patient's tolerance of                            previous anesthesia was also reviewed. The risks                            and benefits of the procedure and the sedation                            options and risks were discussed with the patient.                            All questions were answered, and informed consent                            was obtained. Prior Anticoagulants: The patient has                            taken no previous anticoagulant or antiplatelet                            agents. ASA Grade Assessment: II - A patient with                            mild systemic disease. After reviewing the risks                            and benefits, the patient was deemed in                            satisfactory condition to undergo the procedure.                           After obtaining informed consent, the endoscope was  passed under direct vision. Throughout the                            procedure, the patient's blood pressure, pulse, and                            oxygen saturations were monitored continuously. The                            Endoscope was introduced through the mouth, and                            advanced to the second part of duodenum. The upper                            GI  endoscopy was accomplished without difficulty.                            The patient tolerated the procedure well. Scope In: Scope Out: Findings:                 No endoscopic abnormality was evident in the                            esophagus to explain the patient's complaint of                            dysphagia. It was decided, however, to proceed with                            dilation of the entire esophagus. A guidewire was                            placed and the scope was withdrawn. Dilation was                            performed with a Savary dilator with no resistance                            at 17 mm.                           Patchy mildly erythematous mucosa without bleeding                            was found in the entire examined stomach. Biopsies                            were taken with a cold forceps for histology.                           The exam of the stomach was otherwise normal.  The duodenal bulb and second portion of the                            duodenum were normal. Complications:            No immediate complications. Estimated Blood Loss:     Estimated blood loss was minimal. Impression:               - No endoscopic esophageal abnormality to explain                            patient's dysphagia. Esophagus dilated. Dilated.                           - Erythematous mucosa in the stomach. Biopsied.                           - Normal duodenal bulb and second portion of the                            duodenum. Recommendation:           - Patient has a contact number available for                            emergencies. The signs and symptoms of potential                            delayed complications were discussed with the                            patient. Return to normal activities tomorrow.                            Written discharge instructions were provided to the                            patient.                            - Clear liquid diet for 2 hours, then advance as                            tolerated to soft diet today.                           - Antireflux measures.                           - Continue present medications.                           - Increase omeprazole to 40 mg po bid, ac, 3 months                            of refills.                           -  Await pathology results.                           - Refer to an ENT specialist and Pulmonary                            specialist appointments to be scheduled. Ladene Artist, MD 12/20/2017 8:24:39 AM This report has been signed electronically.

## 2017-12-20 NOTE — Progress Notes (Signed)
A and O x3. Report to RN. Tolerated MAC anesthesia well.Teeth unchanged after procedure.

## 2017-12-20 NOTE — Progress Notes (Signed)
Called to room to assist during endoscopic procedure.  Patient ID and intended procedure confirmed with present staff. Received instructions for my participation in the procedure from the performing physician.  

## 2017-12-20 NOTE — Patient Instructions (Signed)
HANDOUTS GIVEN: POST DILATATION DIET, ANTIREFLUX.   YOU HAD AN ENDOSCOPIC PROCEDURE TODAY AT [facility]:   Refer to the procedure report that was given to you for any specific questions about what was found during the examination.  If the procedure report does not answer your questions, please call your gastroenterologist to clarify.  If you requested that your care partner not be given the details of your procedure findings, then the procedure report has been included in a sealed envelope for you to review at your convenience later.  YOU SHOULD EXPECT: Some feelings of bloating in the abdomen. Passage of more gas than usual.  Walking can help get rid of the air that was put into your GI tract during the procedure and reduce the bloating. If you had a lower endoscopy (such as a colonoscopy or flexible sigmoidoscopy) you may notice spotting of blood in your stool or on the toilet paper. If you underwent a bowel prep for your procedure, you may not have a normal bowel movement for a few days.  Please Note:  You might notice some irritation and congestion in your nose or some drainage.  This is from the oxygen used during your procedure.  There is no need for concern and it should clear up in a day or so.  SYMPTOMS TO REPORT IMMEDIATELY:    Following upper endoscopy (EGD)  Vomiting of blood or coffee ground material  New chest pain or pain under the shoulder blades  Painful or persistently difficult swallowing  New shortness of breath  Fever of 100F or higher  Black, tarry-looking stools  For urgent or emergent issues, a gastroenterologist can be reached at any hour by calling [phone].   DIET:  CLEAR LIQUIDS ONLY FOR TWO HOURS, UNTIL 10:30 AM. THEN ADVANCE TO SOFT DIET TODAY.    ACTIVITY:  You should plan to take it easy for the rest of today and you should NOT DRIVE or use heavy machinery until tomorrow (because of the sedation medicines used during the test).     FOLLOW UP: Our staff will call the number listed on your records the next business day following your procedure to check on you and address any questions or concerns that you may have regarding the information given to you following your procedure. If we do not reach you, we will leave a message.  However, if you are feeling well and you are not experiencing any problems, there is no need to return our call.  We will assume that you have returned to your regular daily activities without incident.  If any biopsies were taken you will be contacted by phone or by letter within the next 1-3 weeks.  Please call us at [phone] if you have not heard about the biopsies in 3 weeks.    SIGNATURES/CONFIDENTIALITY: You and/or your care partner have signed paperwork which will be entered into your electronic medical record.  These signatures attest to the fact that that the information above on your After Visit Summary has been reviewed and is understood.  Full responsibility of the confidentiality of this discharge information lies with you and/or your care-partner.

## 2017-12-20 NOTE — Progress Notes (Signed)
Pt feeling tightness in her chest. Per Gaye Pollack CRNA advised to use inhaler. Pt used inhaler and feeling better.

## 2017-12-21 ENCOUNTER — Telehealth: Payer: Self-pay

## 2017-12-21 ENCOUNTER — Telehealth: Payer: Self-pay | Admitting: *Deleted

## 2017-12-21 DIAGNOSIS — R053 Chronic cough: Secondary | ICD-10-CM

## 2017-12-21 DIAGNOSIS — R05 Cough: Secondary | ICD-10-CM

## 2017-12-21 NOTE — Telephone Encounter (Signed)
  Follow up Call-  Call back number 12/20/2017  Post procedure Call Back phone  # [phone]  Permission to leave phone message Yes  Some recent data might be hidden     Patient questions:  Do you have a fever, pain , or abdominal swelling? No. Pain Score  0 *  Have you tolerated food without any problems? Yes.    Have you been able to return to your normal activities? Yes  Do you have any questions about your discharge instructions: Diet   No. Medications  No. Follow up visit  No.  Do you have questions or concerns about your Care? No.  Actions: * If pain score is 4 or above: No action needed, pain <4.

## 2017-12-21 NOTE — Telephone Encounter (Signed)
Per procedure from 12/21/17 patient needs referral to Pulmonary and ENT  Patient has been scheduled for   Dr. Blenda Nicely at [facility] 12/26/17 9:00 Dr. Chase Caller 01/02/18 9:00  Attempted to reach the patient but was unable to leave a message.  I mailed her a letter with appt details. I will continue to try and reach her.

## 2017-12-24 NOTE — Telephone Encounter (Signed)
Unable to leave a message again.  I will continue to try and reach her

## 2017-12-25 NOTE — Telephone Encounter (Signed)
Unable to leave a message. No answer.

## 2017-12-27 ENCOUNTER — Other Ambulatory Visit: Payer: Self-pay | Admitting: Otolaryngology

## 2017-12-27 DIAGNOSIS — R1314 Dysphagia, pharyngoesophageal phase: Secondary | ICD-10-CM

## 2017-12-30 ENCOUNTER — Encounter: Payer: Self-pay | Admitting: Gastroenterology

## 2018-01-02 ENCOUNTER — Encounter: Payer: Self-pay | Admitting: *Deleted

## 2018-01-02 ENCOUNTER — Ambulatory Visit (INDEPENDENT_AMBULATORY_CARE_PROVIDER_SITE_OTHER): Payer: BLUE CROSS/BLUE SHIELD | Admitting: Internal Medicine

## 2018-01-02 ENCOUNTER — Encounter: Payer: Self-pay | Admitting: Internal Medicine

## 2018-01-02 VITALS — BP 124/82 | HR 114 | Ht 64.0 in | Wt 144.0 lb

## 2018-01-02 DIAGNOSIS — R05 Cough: Secondary | ICD-10-CM | POA: Diagnosis not present

## 2018-01-02 DIAGNOSIS — R059 Cough, unspecified: Secondary | ICD-10-CM

## 2018-01-02 DIAGNOSIS — R053 Chronic cough: Secondary | ICD-10-CM

## 2018-01-02 DIAGNOSIS — F17200 Nicotine dependence, unspecified, uncomplicated: Secondary | ICD-10-CM | POA: Diagnosis not present

## 2018-01-02 LAB — NITRIC OXIDE: NITRIC OXIDE: 18

## 2018-01-02 MED ORDER — PREDNISONE 10 MG PO TABS: ORAL_TABLET | ORAL | 0 refills | Status: DC

## 2018-01-02 NOTE — Progress Notes (Signed)
Subjective:    Patient ID: Mikayla Richardson, female    DOB: [birth year], 47 y.o.   MRN: [id number]  PCP Health, [facility]   HPI   IOV 01/02/2018  , Chief Complaint  Patient presents with  . Consult    Referred by Mikayla Richardson due to chronic cough.  Pt states she has trouble breathing and swallowing along with the cough that has been going on about two months.  Pt states she occ will cough up yellow mucus..  Pt has been diagnosed with asthma.    47 year old smoker.  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  She reports a long history of asthma that has largely been under control for the last 4 to 5 years with her only having to take Singulair on a daily basis.  Then approximately 3 or 4 months ago started having nocturnal proximal nocturnal dyspnea with gasping sensation and dysphagia and a sensation of her throat closing in on her.  This would happen more so at night but also in the daytime.  Then approximately 2 months ago had a bronchitis episode and since then has chronic cough that follows a similar pattern particularly at night.  It is associated with chest tightness and wheezing she was prescribed Advair but due to the expense has not taken it.  She is continued with her Singulair and Claritin.  She has seen Mikayla Richardson and GI.  At the time she started seeing her she was already on Prilosec 20 mg daily for a year.  Dec 20, 2017 she underwent endoscopy.  I personally visualized the report and it is normal.  She is status post esophageal dilatation which she says only helped for a few days but since then has not helped.  After the endoscopy her Prilosec has been doubled but the symptoms have remained unchanged particularly the cough.  Therefore referral to pulmonary was made.  She also referral with ENT pending at this stage.  She does continue to smoke.  Laughing and talking make her cough worse.  Cough is severe as described in the RSI cough score.   Drinking water or trying to stay quiet can help her cough.  The exhaled nitric oxide today is normal.  Review of the chart shows in 2013 she had a chest x-ray that I personally visualized and is clear.  She is not taking any fish oil or ACE inhibitors.   Mikayla Richardson Reflux Symptom Index (> 13-15 suggestive of LPR cough) 0 -> 5  =  none ->severe problem  Hoarseness of problem with voice 3  Clearing  Of Throat 4  Excess throat mucus or feeling of post nasal drip 4  Difficulty swallowing food, liquid or tablets 5  Cough after eating or lying down 4  Breathing difficulties or choking episodes 5  Troublesome or annoying cough 5  Sensation of something sticking in throat or lump in throat 4  Heartburn, chest pain, indigestion, or stomach acid coming up 3  TOTAL 37      has a past medical history of Allergy, Anxiety, Asthma, Blood transfusion without reported diagnosis, Colon polyps, GERD (gastroesophageal reflux disease), Thyroid disease, and UTI (urinary tract infection).   reports that she has been smoking cigarettes.  She has a 3.75 pack-year smoking history. She has never used smokeless tobacco.  Past Surgical History:  Procedure Laterality Date  . ABDOMINAL SURGERY    . APPENDECTOMY    . BACK SURGERY    .  CHOLECYSTECTOMY    . GANGLION CYST EXCISION     right wrist 1992  . HERNIA REPAIR      No Known Allergies  Immunization History  Administered Date(s) Administered  . Influenza Split 06/19/2017  . Pneumococcal Polysaccharide-23 01/08/2013    Family History  Problem Relation Age of Onset  . Heart attack Mother   . Hypertension Mother   . Colon cancer Father   . Diabetes Father   . Asthma Sister   . Stomach cancer Brother   . Stomach cancer Brother   . Esophageal cancer Neg Hx   . Rectal cancer Neg Hx      Current Outpatient Medications:  .  albuterol (PROVENTIL HFA;VENTOLIN HFA) 108 (90 BASE) MCG/ACT inhaler, Inhale 2 puffs into the lungs every 6 (six) hours as  needed. Wheezing/sob, Disp: , Rfl:  .  levothyroxine (SYNTHROID, LEVOTHROID) 88 MCG tablet, Take 88 mcg by mouth every morning., Disp: , Rfl:  .  loratadine (CLARITIN) 10 MG tablet, , Disp: , Rfl:  .  montelukast (SINGULAIR) 10 MG tablet, Take 10 mg by mouth at bedtime., Disp: , Rfl:  .  omeprazole (PRILOSEC) 40 MG capsule, Take 1 capsule (40 mg total) by mouth 2 (two) times daily., Disp: 60 capsule, Rfl: 3  Current Facility-Administered Medications:  .  0.9 %  sodium chloride infusion, 500 mL, Intravenous, Once, Mikayla Artist, Mikayla Richardson    Review of Systems  Constitutional: Positive for unexpected weight change. Negative for fever.  HENT: Positive for sinus pressure, sore throat and trouble swallowing. Negative for congestion, dental problem, ear pain, nosebleeds, postnasal drip, rhinorrhea and sneezing.   Eyes: Negative for redness and itching.  Respiratory: Positive for cough, chest tightness, shortness of breath and wheezing.   Cardiovascular: Negative for palpitations and leg swelling.  Gastrointestinal: Negative for nausea and vomiting.  Genitourinary: Negative for dysuria.  Musculoskeletal: Negative for joint swelling.  Skin: Negative for rash.  Allergic/Immunologic: Positive for environmental allergies. Negative for food allergies and immunocompromised state.  Neurological: Positive for headaches.  Hematological: Does not bruise/bleed easily.  Psychiatric/Behavioral: Negative for dysphoric mood. The patient is nervous/anxious.        Objective:   Physical Exam  Constitutional: She is oriented to person, place, and time. She appears well-developed and well-nourished. No distress.  HENT:  Head: Normocephalic and atraumatic.  Right Ear: External ear normal.  Left Ear: External ear normal.  Mouth/Throat: Oropharynx is clear and moist. No oropharyngeal exudate.  Periodic laryngeal cough esp when taking deep breath  Eyes: Pupils are equal, round, and reactive to light. Conjunctivae  and EOM are normal. Right eye exhibits no discharge. Left eye exhibits no discharge. No scleral icterus.  Neck: Normal range of motion. Neck supple. No JVD present. No tracheal deviation present. No thyromegaly present.  Cardiovascular: Normal rate, regular rhythm, normal heart sounds and intact distal pulses. Exam reveals no gallop and no friction rub.  No murmur heard. Pulmonary/Chest: Effort normal and breath sounds normal. No respiratory distress. She has no wheezes. She has no rales. She exhibits no tenderness.  Abdominal: Soft. Bowel sounds are normal. She exhibits no distension and no mass. There is no tenderness. There is no rebound and no guarding.  Musculoskeletal: Normal range of motion. She exhibits no edema or tenderness.  Lymphadenopathy:    She has no cervical adenopathy.  Neurological: She is alert and oriented to person, place, and time. She has normal reflexes. No cranial nerve deficit. She exhibits normal muscle tone. Coordination normal.  Skin: Skin is warm and dry. No rash noted. She is not diaphoretic. No erythema. No pallor.  Psychiatric: She has a normal mood and affect. Her behavior is normal. Judgment and thought content normal.  Vitals reviewed.   Vitals:   01/02/18 0903  BP: 124/82  Pulse: (!) 114  SpO2: 98%  Weight: 144 lb (65.3 kg)  Height: 5\' 4"  (1.626 m)    Estimated body mass index is 24.72 kg/m as calculated from the following:   Height as of this encounter: 5\' 4"  (1.626 m).   Weight as of this encounter: 144 lb (65.3 kg).         Assessment & Plan:     ICD-10-CM   1. Chronic cough R05 Pulmonary function test  2. Tobacco use disorder F17.200   3. Cough R05 CT Chest High Resolution    Given her smoking history I want to rule out any COPD at this stage.  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  She is also a candidate for interstitial lung disease.  I will treat her empirically with  prednisone because she has never had this treatment.  I also encouraged her to do some voice rest drink water and suck on sugarless lozenge.  Meanwhile I will get some high-resolution CT chest and primary function test in the next few weeks and regroup.  If the cough only has a partial response or no response and investigation is noncontributory then will commit her to gabapentin and voice rehab for cough neuropathy.  I have encouraged her to quit smoking.   Mikayla Richardson, M.D., [facility].C.P Pulmonary and Critical Care Medicine Staff Physician, [place] Director - Interstitial Lung Disease  Program  Pulmonary [place] [facility] [place], [place], [address]  Pager: [phone], If no answer or between  15:00h - 7:00h: call [phone] Telephone: [phone]

## 2018-01-02 NOTE — Patient Instructions (Addendum)
ICD-10-CM   1. Chronic cough R05   2. Tobacco use disorder F17.200      Take prednisone 40 mg daily x 2 days, then 20mg  daily x 2 days, then 10mg  daily x 2 days, then 5mg  daily x 2 days and stop  Do 2 days of total voice rest  Continue double dose prilosec  contininue claritin and singulair  Any time there is urge to cough, drink water or suck on sugarless lozenge   Do HRCT anytime next few weeks  Do full PFt anytime next few weeks  Followup In 2-4 weeks with aPP or Dr Chase Caller  After tests - RSI cough score at followup  - if no or partial response only will consider gabapentin +/- voice rehab

## 2018-01-10 ENCOUNTER — Ambulatory Visit (INDEPENDENT_AMBULATORY_CARE_PROVIDER_SITE_OTHER)
Admission: RE | Admit: 2018-01-10 | Discharge: 2018-01-10 | Disposition: A | Discharge status: Home / Self Care | Payer: BLUE CROSS/BLUE SHIELD | Source: Ambulatory Visit | Attending: Internal Medicine | Admitting: Internal Medicine

## 2018-01-10 ENCOUNTER — Ambulatory Visit
Admission: RE | Admit: 2018-01-10 | Discharge: 2018-01-10 | Disposition: A | Discharge status: Home / Self Care | Payer: BLUE CROSS/BLUE SHIELD | Source: Ambulatory Visit | Attending: Otolaryngology | Admitting: Otolaryngology

## 2018-01-10 DIAGNOSIS — R059 Cough, unspecified: Secondary | ICD-10-CM

## 2018-01-10 DIAGNOSIS — R1314 Dysphagia, pharyngoesophageal phase: Secondary | ICD-10-CM

## 2018-01-10 DIAGNOSIS — R05 Cough: Secondary | ICD-10-CM | POA: Diagnosis not present

## 2018-01-21 ENCOUNTER — Telehealth: Payer: Self-pay | Admitting: Internal Medicine

## 2018-01-21 NOTE — Telephone Encounter (Signed)
CT chest is normal but for 62mm  RUL nodule that is unlikely cancer. SHe is having PFT in July . Should see APP for followup at that time   Musculoskeletal:  No aggressive appearing focal osseous lesions.  IMPRESSION: 1. No evidence of interstitial lung disease. 2. Solid 3 mm apical right upper lobe pulmonary nodule. No follow-up needed if patient is low-risk. Non-contrast chest CT can be considered in 12 months if patient is high-risk. This recommendation follows the consensus statement: Guidelines for Management of Incidental Pulmonary Nodules Detected on CT Images: From the Fleischner Society 2017; Radiology 2017; 920:100-712.1.  Aortic Atherosclerosis (ICD10-I70.0).   Electronically Signed   By: Ilona Sorrel M.D.   On: 01/11/2018 09:19

## 2018-01-22 NOTE — Telephone Encounter (Signed)
Pt has a f/u appt with TP and a PFT before 01/23/18.  Called and spoke with pt letting her know the results of the scan and stated TP would go over results in more detail with her at [facility] tomorrow.  Pt expressed understanding. Nothing further needed.

## 2018-01-23 ENCOUNTER — Ambulatory Visit (INDEPENDENT_AMBULATORY_CARE_PROVIDER_SITE_OTHER): Payer: BLUE CROSS/BLUE SHIELD | Admitting: Internal Medicine

## 2018-01-23 ENCOUNTER — Encounter: Payer: Self-pay | Admitting: Adult Health

## 2018-01-23 ENCOUNTER — Ambulatory Visit (INDEPENDENT_AMBULATORY_CARE_PROVIDER_SITE_OTHER): Payer: BLUE CROSS/BLUE SHIELD | Admitting: Adult Health

## 2018-01-23 DIAGNOSIS — F17200 Nicotine dependence, unspecified, uncomplicated: Secondary | ICD-10-CM | POA: Diagnosis not present

## 2018-01-23 DIAGNOSIS — R918 Other nonspecific abnormal finding of lung field: Secondary | ICD-10-CM | POA: Diagnosis not present

## 2018-01-23 DIAGNOSIS — J302 Other seasonal allergic rhinitis: Secondary | ICD-10-CM | POA: Diagnosis not present

## 2018-01-23 DIAGNOSIS — J4530 Mild persistent asthma, uncomplicated: Secondary | ICD-10-CM

## 2018-01-23 DIAGNOSIS — R05 Cough: Secondary | ICD-10-CM | POA: Diagnosis not present

## 2018-01-23 DIAGNOSIS — K219 Gastro-esophageal reflux disease without esophagitis: Secondary | ICD-10-CM | POA: Diagnosis not present

## 2018-01-23 DIAGNOSIS — R911 Solitary pulmonary nodule: Secondary | ICD-10-CM | POA: Diagnosis not present

## 2018-01-23 DIAGNOSIS — R053 Chronic cough: Secondary | ICD-10-CM

## 2018-01-23 LAB — PULMONARY FUNCTION TEST
DL/VA % PRED: 76 %
DL/VA: 3.7 ml/min/mmHg/L
DLCO UNC: 20.01 ml/min/mmHg
DLCO unc % pred: 80 %
FEF 25-75 Post: 3.38 L/sec
FEF 25-75 Pre: 2.27 L/sec
FEF2575-%Change-Post: 48 %
FEF2575-%Pred-Post: 120 %
FEF2575-%Pred-Pre: 80 %
FEV1-%Change-Post: 12 %
FEV1-%PRED-PRE: 91 %
FEV1-%Pred-Post: 103 %
FEV1-PRE: 2.64 L
FEV1-Post: 2.96 L
FEV1FVC-%Change-Post: 4 %
FEV1FVC-%Pred-Pre: 97 %
FEV6-%Change-Post: 6 %
FEV6-%PRED-PRE: 95 %
FEV6-%Pred-Post: 102 %
FEV6-POST: 3.6 L
FEV6-PRE: 3.37 L
FEV6FVC-%PRED-POST: 102 %
FEV6FVC-%Pred-Pre: 102 %
FVC-%Change-Post: 6 %
FVC-%PRED-POST: 99 %
FVC-%PRED-PRE: 93 %
FVC-POST: 3.6 L
FVC-PRE: 3.37 L
POST FEV6/FVC RATIO: 100 %
PRE FEV6/FVC RATIO: 100 %
Post FEV1/FVC ratio: 82 %
Pre FEV1/FVC ratio: 78 %
RV % pred: 149 %
RV: 2.69 L
TLC % PRED: 115 %
TLC: 5.93 L

## 2018-01-23 MED ORDER — BENZONATATE 200 MG PO CAPS: 200.0000 mg | ORAL_CAPSULE | Freq: Three times a day (TID) | ORAL | 1 refills | Status: AC | PRN

## 2018-01-23 MED ORDER — BUDESONIDE-FORMOTEROL FUMARATE 80-4.5 MCG/ACT IN AERO: 2.0000 | INHALATION_SPRAY | Freq: Two times a day (BID) | RESPIRATORY_TRACT | 0 refills | Status: DC

## 2018-01-23 MED ORDER — BUDESONIDE-FORMOTEROL FUMARATE 80-4.5 MCG/ACT IN AERO: 2.0000 | INHALATION_SPRAY | Freq: Two times a day (BID) | RESPIRATORY_TRACT | 3 refills | Status: DC

## 2018-01-23 MED ORDER — ALBUTEROL SULFATE (2.5 MG/3ML) 0.083% IN NEBU: 2.5000 mg | INHALATION_SOLUTION | Freq: Four times a day (QID) | RESPIRATORY_TRACT | 3 refills | Status: DC | PRN

## 2018-01-23 NOTE — Progress Notes (Signed)
PFT done today. 

## 2018-01-23 NOTE — Assessment & Plan Note (Signed)
Control for triggers   Plan  Patient Instructions  Set up CT chest in 1 year to follow lung nodule Begin Symbicort 80 2 puffs twice daily, rinse after use Continue on Claritin 10 mg daily Saline nasal rinses as needed Use sips of water to soothe throat and avoid coughing and throat clearing Avoid mint products Begin Delsym 2 teaspoons twice daily for cough Tessalon Perles 3 times daily as needed for cough Work on not smoking Follow-up with Dr. Chase Caller in 2 months and as needed Please contact office for sooner follow up if symptoms do not improve or worsen or seek emergency care

## 2018-01-23 NOTE — Assessment & Plan Note (Signed)
Smoking cessation  

## 2018-01-23 NOTE — Assessment & Plan Note (Signed)
GERD diet  PPI Twice daily

## 2018-01-23 NOTE — Assessment & Plan Note (Signed)
Mild persistent asthma with upper airway cough most likely with chronic rhinitis and GERD triggers. Trial of ICS/lama.  Control for triggers and cough   Plan  Patient Instructions  Set up CT chest in 1 year to follow lung nodule Begin Symbicort 80 2 puffs twice daily, rinse after use Continue on Claritin 10 mg daily Saline nasal rinses as needed Use sips of water to soothe throat and avoid coughing and throat clearing Avoid mint products Begin Delsym 2 teaspoons twice daily for cough Tessalon Perles 3 times daily as needed for cough Work on not smoking Follow-up with Dr. Chase Caller in 2 months and as needed Please contact office for sooner follow up if symptoms do not improve or worsen or seek emergency care

## 2018-01-23 NOTE — Assessment & Plan Note (Signed)
CT chest June 2019 showed a 3 mm right upper lobe nodule.  Patient is active smoker will need a follow-up CT chest in 1 year

## 2018-01-23 NOTE — Progress Notes (Signed)
@Patient  ID: Mikayla Richardson, female    DOB: [birth year], 47 y.o.   MRN: [id number]  Chief Complaint  Patient presents with  . Follow-up    Cough    Referring provider: [facility], [facility]  HPI: 47 year old female active smoker seen for pulmonary consult Jan 02, 2018 for chronic cough Past medical history significant for asthma and GERD   TEST  Jan 02, 2018 Dr. Terie Purser reflux symptom index-37  01/23/2018 Follow up ; Cough /Asthma  Patient presents for a 3-week follow-up.  Patient was seen last visit for a pulmonary consult for chronic cough.  Patient was given a prednisone taper.  And instructed to control for triggers with Prilosec twice daily.  Voice rest.  She was set up for a high-resolution CT chest that showed no evidence of interstitial lung disease.  3 mm right upper lobe nodule noted.  CT chest in 1 year has been recommended Cough score/reflux symptom index decreased at 29 today. Says she is doing some better.  Cough has decreased but continues to have frequent dry cough that is intermittently productive with mucus.  Has frequent throat clearing and sinus drainage.  She remains on Prilosec twice daily for reflux and does seem to have some improvement since she had a esophageal dilatation. PFTs were done today that show normal lung function with FEV1 103%, ratio 82, FVC 99%, positive bronchodilator response, DLCO 80%   No Known Allergies  Immunization History  Administered Date(s) Administered  . Influenza Split 06/19/2017  . Pneumococcal Polysaccharide-23 01/08/2013    Past Medical History:  Diagnosis Date  . Allergy   . Anxiety   . Asthma   . Blood transfusion without reported diagnosis    1991 after childbirth  . Colon polyps   . GERD (gastroesophageal reflux disease)   . Thyroid disease   . UTI (urinary tract infection)     Tobacco History: Social History   Tobacco Use  Smoking Status Current Every Day Smoker  . Packs/day: 0.25  . Years: 15.00  . Pack  years: 3.75  . Types: Cigarettes  Smokeless Tobacco Never Used   Ready to quit: No Counseling given: Yes   Outpatient Encounter Medications as of 01/23/2018  Medication Sig  . albuterol (PROVENTIL HFA;VENTOLIN HFA) 108 (90 BASE) MCG/ACT inhaler Inhale 2 puffs into the lungs every 6 (six) hours as needed. Wheezing/sob  . albuterol (PROVENTIL) (2.5 MG/3ML) 0.083% nebulizer solution Take 3 mLs (2.5 mg total) by nebulization every 6 (six) hours as needed for wheezing or shortness of breath.  . levothyroxine (SYNTHROID, LEVOTHROID) 100 MCG tablet Take 100 mcg by mouth every morning.   . loratadine (CLARITIN) 10 MG tablet   . montelukast (SINGULAIR) 10 MG tablet Take 10 mg by mouth at bedtime.  Marland Kitchen omeprazole (PRILOSEC) 40 MG capsule Take 1 capsule (40 mg total) by mouth 2 (two) times daily.  . [DISCONTINUED] albuterol (PROVENTIL) (2.5 MG/3ML) 0.083% nebulizer solution Take 2.5 mg by nebulization every 6 (six) hours as needed for wheezing or shortness of breath.  . benzonatate (TESSALON) 200 MG capsule Take 1 capsule (200 mg total) by mouth 3 (three) times daily as needed for cough.  . budesonide-formoterol (SYMBICORT) 80-4.5 MCG/ACT inhaler Inhale 2 puffs into the lungs 2 (two) times daily.  . budesonide-formoterol (SYMBICORT) 80-4.5 MCG/ACT inhaler Inhale 2 puffs into the lungs 2 (two) times daily.  . [DISCONTINUED] predniSONE (DELTASONE) 10 MG tablet Take 40mg x2days,20mg x2days,10mg x2days,5mg x2days,then stop (Patient not taking: Reported on 01/23/2018)   Facility-Administered Encounter Medications as  of 01/23/2018  Medication  . 0.9 %  sodium chloride infusion     Review of Systems  Constitutional:   No  weight loss, night sweats,  Fevers, chills, fatigue, or  lassitude.  HEENT:   No headaches,  Difficulty swallowing,  Tooth/dental problems, or  Sore throat,                No sneezing, itching, ear ache,  +nasal congestion, post nasal drip,   CV:  No chest pain,  Orthopnea, PND, swelling  in lower extremities, anasarca, dizziness, palpitations, syncope.   GI  No heartburn, indigestion, abdominal pain, nausea, vomiting, diarrhea, change in bowel habits, loss of appetite, bloody stools.   Resp:  .  No chest wall deformity  Skin: no rash or lesions.  GU: no dysuria, change in color of urine, no urgency or frequency.  No flank pain, no hematuria   MS:  No joint pain or swelling.  No decreased range of motion.  No back pain.    Physical Exam  BP 120/62 (BP Location: Left Arm, Cuff Size: Normal)   Pulse 70   Ht 5' 5.5" (1.664 m)   Wt 144 lb (65.3 kg)   LMP 06/04/2012   SpO2 100%   BMI 23.60 kg/m   GEN: A/Ox3; pleasant , NAD,    HEENT:  [place]/AT,  EACs-clear, TMs-wnl, NOSE-clear drainage , THROAT-clear, no lesions, no postnasal drip or exudate noted.   NECK:  Supple w/ fair ROM; no JVD; normal carotid impulses w/o bruits; no thyromegaly or nodules palpated; no lymphadenopathy.    RESP  Clear  P & A; w/o, wheezes/ rales/ or rhonchi. no accessory muscle use, no dullness to percussion  CARD:  RRR, no m/r/g, no peripheral edema, pulses intact, no cyanosis or clubbing.  GI:   Soft & nt; nml bowel sounds; no organomegaly or masses detected.   Musco: Warm bil, no deformities or joint swelling noted.   Neuro: alert, no focal deficits noted.    Skin: Warm, no lesions or rashes    Lab Results:    BNP No results found for: BNP  ProBNP No results found for: PROBNP  Imaging: Dg Esophagus  Result Date: 01/10/2018 CLINICAL DATA:  Pharyngo esophageal dysphagia. Patient reports a history of esophageal dilation for globus sensation in the throat 1 month prior. EXAM: ESOPHOGRAM / BARIUM SWALLOW / BARIUM TABLET STUDY TECHNIQUE: Combined double contrast and single contrast examination performed using effervescent crystals, thick barium liquid, and thin barium liquid. The patient was observed with fluoroscopy swallowing a 13 mm barium sulphate tablet. FLUOROSCOPY TIME:   Fluoroscopy Time:  2 minutes 48 seconds Radiation Exposure Index (if provided by the fluoroscopic device): 39 mGy Number of Acquired Spot Images: 9 COMPARISON:  03/21/2005 CT abdomen/pelvis. FINDINGS: Oral and pharyngeal phases of swallowing are normal, with no laryngeal penetration or tracheobronchial aspiration. No evidence of pharyngeal mass, stricture or diverticulum. No evidence of significant cricopharyngeus muscle dysfunction. No hiatal hernia. Mild gastroesophageal reflux was elicited to the level of midthoracic esophagus with water siphon test. Minimal esophageal dysmotility, characterized by intermittent slight weakening of primary peristalsis in the lower thoracic esophagus. Mild granularity of esophageal mucosa in mid to lower thoracic esophagus suggesting mild reflux esophagitis. No evidence of esophageal mass, stricture or ulcer. Swallowed barium tablet traversed the esophagus into the stomach without delay. IMPRESSION: 1. Normal oral and pharyngeal phase of swallowing, with no laryngeal penetration or tracheobronchial aspiration. 2. No hiatal hernia.  Mild gastroesophageal reflux. 3. Minimal esophageal  dysmotility, characteristic of reflux related dysmotility. 4. Suggestion of mild reflux esophagitis in the mid to lower thoracic esophagus, with no evidence of esophageal mass, stricture or ulcer. Electronically Signed   By: Ilona Sorrel M.D.   On: 01/10/2018 11:27   Ct Chest High Resolution  Result Date: 01/11/2018 CLINICAL DATA:  Persistent cough, chronic for years. EXAM: CT CHEST WITHOUT CONTRAST TECHNIQUE: Multidetector CT imaging of the chest was performed following the standard protocol without intravenous contrast. High resolution imaging of the lungs, as well as inspiratory and expiratory imaging, was performed. COMPARISON:  08/04/2012 chest radiograph. FINDINGS: Cardiovascular: Normal heart size. No significant pericardial effusion/thickening. Atherosclerotic nonaneurysmal thoracic aorta.  Normal caliber pulmonary arteries. Mediastinum/Nodes: No discrete thyroid nodules. Unremarkable esophagus. No pathologically enlarged axillary, mediastinal or hilar lymph nodes, noting limited sensitivity for the detection of hilar adenopathy on this noncontrast study. Lungs/Pleura: No pneumothorax. No pleural effusion. No acute consolidative airspace disease or lung masses. A few scattered tiny calcified granulomas are noted in both lungs. Solid 3 mm apical right upper lobe pulmonary nodule (series 3/image 20). No significant regions of subpleural reticulation, ground-glass attenuation, traction bronchiectasis, parenchymal banding, architectural distortion or frank honeycombing. Upper abdomen: Surgical clips are noted in the right upper quadrant of the abdomen. Musculoskeletal:  No aggressive appearing focal osseous lesions. IMPRESSION: 1. No evidence of interstitial lung disease. 2. Solid 3 mm apical right upper lobe pulmonary nodule. No follow-up needed if patient is low-risk. Non-contrast chest CT can be considered in 12 months if patient is high-risk. This recommendation follows the consensus statement: Guidelines for Management of Incidental Pulmonary Nodules Detected on CT Images: From the Fleischner Society 2017; Radiology 2017; 270:350-093.8. Aortic Atherosclerosis (ICD10-I70.0). Electronically Signed   By: Ilona Sorrel M.D.   On: 01/11/2018 09:19     Assessment & Plan:   Asthma Mild persistent asthma with upper airway cough most likely with chronic rhinitis and GERD triggers. Trial of ICS/lama.  Control for triggers and cough   Plan  Patient Instructions  Set up CT chest in 1 year to follow lung nodule Begin Symbicort 80 2 puffs twice daily, rinse after use Continue on Claritin 10 mg daily Saline nasal rinses as needed Use sips of water to soothe throat and avoid coughing and throat clearing Avoid mint products Begin Delsym 2 teaspoons twice daily for cough Tessalon Perles 3 times daily  as needed for cough Work on not smoking Follow-up with Dr. Chase Caller in 2 months and as needed Please contact office for sooner follow up if symptoms do not improve or worsen or seek emergency care       Allergic rhinitis Control for triggers   Plan  Patient Instructions  Set up CT chest in 1 year to follow lung nodule Begin Symbicort 80 2 puffs twice daily, rinse after use Continue on Claritin 10 mg daily Saline nasal rinses as needed Use sips of water to soothe throat and avoid coughing and throat clearing Avoid mint products Begin Delsym 2 teaspoons twice daily for cough Tessalon Perles 3 times daily as needed for cough Work on not smoking Follow-up with Dr. Chase Caller in 2 months and as needed Please contact office for sooner follow up if symptoms do not improve or worsen or seek emergency care       Tobacco use disorder Smoking cessation   GERD (gastroesophageal reflux disease) GERD diet  PPI Twice daily         Tammy Parrett, NP 01/23/2018

## 2018-01-23 NOTE — Patient Instructions (Addendum)
Set up CT chest in 1 year to follow lung nodule Begin Symbicort 80 2 puffs twice daily, rinse after use Continue on Claritin 10 mg daily Saline nasal rinses as needed Use sips of water to soothe throat and avoid coughing and throat clearing Avoid mint products Begin Delsym 2 teaspoons twice daily for cough Tessalon Perles 3 times daily as needed for cough Work on not smoking Follow-up with Dr. Chase Caller in 2 months and as needed Please contact office for sooner follow up if symptoms do not improve or worsen or seek emergency care

## 2018-03-17 ENCOUNTER — Other Ambulatory Visit: Payer: Self-pay | Admitting: Gastroenterology

## 2018-03-29 ENCOUNTER — Ambulatory Visit (INDEPENDENT_AMBULATORY_CARE_PROVIDER_SITE_OTHER): Payer: BLUE CROSS/BLUE SHIELD | Admitting: Internal Medicine

## 2018-03-29 ENCOUNTER — Encounter: Payer: Self-pay | Admitting: Internal Medicine

## 2018-03-29 VITALS — BP 108/64 | HR 74 | Ht 64.0 in | Wt 148.0 lb

## 2018-03-29 DIAGNOSIS — J387 Other diseases of larynx: Secondary | ICD-10-CM | POA: Diagnosis not present

## 2018-03-29 DIAGNOSIS — R05 Cough: Secondary | ICD-10-CM | POA: Diagnosis not present

## 2018-03-29 DIAGNOSIS — R053 Chronic cough: Secondary | ICD-10-CM

## 2018-03-29 MED ORDER — GABAPENTIN 300 MG PO CAPS: ORAL_CAPSULE | ORAL | 2 refills | Status: DC

## 2018-03-29 NOTE — Progress Notes (Signed)
Subjective:     Patient ID: Mikayla Richardson, female   DOB: 1971-07-01, 47 y.o.   MRN: [id number]  HPI    Patient ID: Mikayla Richardson, female    DOB: 21-Mar-1971, 47 y.o.   MRN: [id number]  PCP Health, [facility]   HPI   IOV 01/02/2018  , Chief Complaint  Patient presents with  . Consult    Referred by Dr. Silvio Pate due to chronic cough.  Pt states she has trouble breathing and swallowing along with the cough that has been going on about two months.  Pt states she occ will cough up yellow mucus..  Pt has been diagnosed with asthma.    47 year old smoker.  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  She reports a long history of asthma that has largely been under control for the last 4 to 5 years with her only having to take Singulair on a daily basis.  Then approximately 3 or 4 months ago started having nocturnal proximal nocturnal dyspnea with gasping sensation and dysphagia and a sensation of her throat closing in on her.  This would happen more so at night but also in the daytime.  Then approximately 2 months ago had a bronchitis episode and since then has chronic cough that follows a similar pattern particularly at night.  It is associated with chest tightness and wheezing she was prescribed Advair but due to the expense has not taken it.  She is continued with her Singulair and Claritin.  She has seen Dr. Lucio Edward and GI.  At the time she started seeing her she was already on Prilosec 20 mg daily for a year.  Dec 20, 2017 she underwent endoscopy.  I personally visualized the report and it is normal.  She is status post esophageal dilatation which she says only helped for a few days but since then has not helped.  After the endoscopy her Prilosec has been doubled but the symptoms have remained unchanged particularly the cough.  Therefore referral to pulmonary was made.  She also referral with ENT pending at this stage.  She does continue to smoke.  Laughing  and talking make her cough worse.  Cough is severe as described in the RSI cough score.  Drinking water or trying to stay quiet can help her cough.  The exhaled nitric oxide today is normal.  Review of the chart shows in 2013 she had a chest x-ray that I personally visualized and is clear.  She is not taking any fish oil or ACE inhibitors.    01/23/2018 Follow up ; Cough /Asthma  Patient presents for a 3-week follow-up.  Patient was seen last visit for a pulmonary consult for chronic cough.  Patient was given a prednisone taper.  And instructed to control for triggers with Prilosec twice daily.  Voice rest.  She was set up for a high-resolution CT chest that showed no evidence of interstitial lung disease.  3 mm right upper lobe nodule noted.  CT chest in 1 year has been recommended Cough score/reflux symptom index decreased at 29 today. Says she is doing some better.  Cough has decreased but continues to have frequent dry cough that is intermittently productive with mucus.  Has frequent throat clearing and sinus drainage.  She remains on Prilosec twice daily for reflux and does seem to have some improvement since she had a esophageal dilatation. PFTs were done today that show normal lung function with FEV1 103%,  ratio 82, FVC 99%, positive bronchodilator response, DLCO 80%   No Known Allergies   OV 03/29/2018  Chief Complaint  Patient presents with  . Follow-up    Pt states she had a bad spell x3 weeks with a cough that started 7/19 and ended around 8/9. Pt stated she was also coughing up mucus that was occ. clear to green     Mikayla Richardson presents for follow-up of chronic cough. Since I last saw her she went to [place] mid July 2019 and then got sick and had 2 weeks of worsening cough which is now returned to baseline.At this point in time she is compliant with Symbicort and Singulair and the RSI cough score is improved from 37-23. 23 is arecent baseline. She continues to be bothered by  the cough with a lot of clearing of the throat. Cough is particularly worse when she lies downand when she talks and when she laughs. Cough does not bother her much in the middle of the night although occasionally does do that. She cannot remember the prednisone taper that I gave her has helped. She works at [employer] and [employer] and has to talk a lot    Dr Lorenza Cambridge Reflux Symptom Index (> 13-15 suggestive of LPR cough) Results for Mikayla, Richardson (MRN [id number]) as of 03/29/2018 09:14  Ref. Range 01/02/2018 09:45  Nitric Oxide Unknown 18    03/29/2018   Hoarseness of problem with voice 3 3  Clearing  Of Throat 4 3  Excess throat mucus or feeling of post nasal drip 4 1  Difficulty swallowing food, liquid or tablets 5 2  Cough after eating or lying down 4 4  Breathing difficulties or choking episodes 5 4  Troublesome or annoying cough 5 3  Sensation of something sticking in throat or lump in throat 4 2  Heartburn, chest pain, indigestion, or stomach acid coming up 3 1  TOTAL 37 23     has a past medical history of Allergy, Anxiety, Asthma, Blood transfusion without reported diagnosis, Colon polyps, GERD (gastroesophageal reflux disease), Thyroid disease, and UTI (urinary tract infection).   reports that she has been smoking cigarettes. She has a 3.75 pack-year smoking history. She has never used smokeless tobacco.  Past Surgical History:  Procedure Laterality Date  . ABDOMINAL SURGERY    . APPENDECTOMY    . BACK SURGERY    . CHOLECYSTECTOMY    . GANGLION CYST EXCISION     right wrist 1992  . HERNIA REPAIR      No Known Allergies  Immunization History  Administered Date(s) Administered  . Influenza Split 06/19/2017  . Pneumococcal Polysaccharide-23 01/08/2013    Family History  Problem Relation Age of Onset  . Heart attack Mother   . Hypertension Mother   . Colon cancer Father   . Diabetes Father   . Asthma Sister   . Stomach cancer Brother   . Stomach  cancer Brother   . Esophageal cancer Neg Hx   . Rectal cancer Neg Hx      Current Outpatient Medications:  .  albuterol (PROVENTIL HFA;VENTOLIN HFA) 108 (90 BASE) MCG/ACT inhaler, Inhale 2 puffs into the lungs every 6 (six) hours as needed. Wheezing/sob, Disp: , Rfl:  .  albuterol (PROVENTIL) (2.5 MG/3ML) 0.083% nebulizer solution, Take 3 mLs (2.5 mg total) by nebulization every 6 (six) hours as needed for wheezing or shortness of breath., Disp: 75 mL, Rfl: 3 .  benzonatate (TESSALON) 200 MG capsule,  Take 1 capsule (200 mg total) by mouth 3 (three) times daily as needed for cough., Disp: 30 capsule, Rfl: 1 .  budesonide-formoterol (SYMBICORT) 80-4.5 MCG/ACT inhaler, Inhale 2 puffs into the lungs 2 (two) times daily., Disp: 1 Inhaler, Rfl: 3 .  levothyroxine (SYNTHROID, LEVOTHROID) 100 MCG tablet, Take 100 mcg by mouth every morning. , Disp: , Rfl:  .  loratadine (CLARITIN) 10 MG tablet, , Disp: , Rfl:  .  montelukast (SINGULAIR) 10 MG tablet, Take 10 mg by mouth at bedtime., Disp: , Rfl:  .  omeprazole (PRILOSEC) 40 MG capsule, TAKE 1 CAPSULE BY MOUTH TWICE A DAY, Disp: 180 capsule, Rfl: 1  Current Facility-Administered Medications:  .  0.9 %  sodium chloride infusion, 500 mL, Intravenous, Once, Ladene Artist, MD   Review of Systems     Objective:   Physical Exam  Constitutional: She is oriented to person, place, and time. She appears well-developed and well-nourished. No distress.  HENT:  Head: Normocephalic and atraumatic.  Right Ear: External ear normal.  Left Ear: External ear normal.  Mouth/Throat: Oropharynx is clear and moist. No oropharyngeal exudate.  Mild smell of tobacco  Eyes: Pupils are equal, Richardson, and reactive to light. Conjunctivae and EOM are normal. Right eye exhibits no discharge. Left eye exhibits no discharge. No scleral icterus.  Neck: Normal range of motion. Neck supple. No JVD present. No tracheal deviation present. No thyromegaly present.   Cardiovascular: Normal rate, regular rhythm, normal heart sounds and intact distal pulses. Exam reveals no gallop and no friction rub.  No murmur heard. Pulmonary/Chest: Effort normal and breath sounds normal. No respiratory distress. She has no wheezes. She has no rales. She exhibits no tenderness.  Abdominal: Soft. Bowel sounds are normal. She exhibits no distension and no mass. There is no tenderness. There is no rebound and no guarding.  Musculoskeletal: Normal range of motion. She exhibits no edema or tenderness.  Lymphadenopathy:    She has no cervical adenopathy.  Neurological: She is alert and oriented to person, place, and time. She has normal reflexes. No cranial nerve deficit. She exhibits normal muscle tone. Coordination normal.  Skin: Skin is warm and dry. No rash noted. She is not diaphoretic. No erythema. No pallor.  Psychiatric: She has a normal mood and affect. Her behavior is normal. Judgment and thought content normal.  Vitals reviewed.  Vitals:   03/29/18 0900  BP: 108/64  Pulse: 74  SpO2: 97%  Weight: 148 lb (67.1 kg)  Height: 5\' 4"  (1.626 m)    Estimated body mass index is 25.4 kg/m as calculated from the following:   Height as of this encounter: 5\' 4"  (1.626 m).   Weight as of this encounter: 148 lb (67.1 kg).     Assessment:       ICD-10-CM   1. 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 If this makes you too sleepy or drowsy call us and we will cut your medication dosing down  Attend voice rehab  Mr Valentino Saxon  Choose 2-3 days of total voice rest - without whisper or talking - do it next 30 days  Followup 6-8 weeks ; RSI Cough score at followup   Dr. Brand Males, M.D., [facility].C.P Pulmonary and Critical Care Medicine Staff Physician, [place]  [facility] Director - Interstitial Lung Disease   Program  Pulmonary [place] at [place], [place], [address]  Pager: [phone], If no answer or between  15:00h - 7:00h: call [phone] Telephone: [phone]

## 2018-03-29 NOTE — Addendum Note (Signed)
Addended by: Lorretta Harp on: 03/29/2018 09:32 AM   Modules accepted: Orders

## 2018-03-29 NOTE — Patient Instructions (Addendum)
1. Chronic cough 2. Irritable larynx  Take gabapentin 300mg  once daily at night x 7 days, then 300mg  twice daily x 7 days, then 300mg  three times daily to continue. If this makes you too sleepy or drowsy call us and we will cut your medication dosing down  Attend voice rehab  Mr Valentino Saxon  Choose 2-3 days of total voice rest - without whisper or talking - do it next 30 days  Followup 6-8 weeks ; RSI Cough score at followup

## 2018-04-15 ENCOUNTER — Telehealth: Payer: Self-pay

## 2018-04-15 NOTE — Telephone Encounter (Signed)
Left message on voicemail to return call with information to obtain colonoscopy/path reports.

## 2018-04-21 ENCOUNTER — Other Ambulatory Visit: Payer: Self-pay | Admitting: Adult Health

## 2018-05-02 ENCOUNTER — Other Ambulatory Visit: Payer: Self-pay | Admitting: Internal Medicine

## 2018-05-10 ENCOUNTER — Ambulatory Visit: Payer: BLUE CROSS/BLUE SHIELD | Admitting: Internal Medicine

## 2018-05-14 ENCOUNTER — Encounter: Payer: Self-pay | Admitting: Internal Medicine

## 2018-05-14 ENCOUNTER — Ambulatory Visit (INDEPENDENT_AMBULATORY_CARE_PROVIDER_SITE_OTHER): Payer: BLUE CROSS/BLUE SHIELD | Admitting: Internal Medicine

## 2018-05-14 VITALS — BP 110/70 | HR 80 | Ht 64.0 in | Wt 149.6 lb

## 2018-05-14 DIAGNOSIS — J387 Other diseases of larynx: Secondary | ICD-10-CM | POA: Diagnosis not present

## 2018-05-14 DIAGNOSIS — R053 Chronic cough: Secondary | ICD-10-CM

## 2018-05-14 DIAGNOSIS — R05 Cough: Secondary | ICD-10-CM

## 2018-05-14 DIAGNOSIS — Z23 Encounter for immunization: Secondary | ICD-10-CM | POA: Diagnosis not present

## 2018-05-14 MED ORDER — GABAPENTIN 300 MG PO CAPS: 300.0000 mg | ORAL_CAPSULE | Freq: Three times a day (TID) | ORAL | 1 refills | Status: DC

## 2018-05-14 NOTE — Patient Instructions (Addendum)
ICD-10-CM   1. 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

## 2018-05-14 NOTE — Progress Notes (Signed)
Patient ID: JAVERIA BRISKI, female    DOB: 1971/08/03, 47 y.o.   MRN: [id number]  PCP Health, [facility]   HPI   IOV 01/02/2018  , Chief Complaint  Patient presents with  . Consult    Referred by Dr. Silvio Pate due to chronic cough.  Pt states she has trouble breathing and swallowing along with the cough that has been going on about two months.  Pt states she occ will cough up yellow mucus..  Pt has been diagnosed with asthma.    47 year old smoker.  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  She reports a long history of asthma that has largely been under control for the last 4 to 5 years with her only having to take Singulair on a daily basis.  Then approximately 3 or 4 months ago started having nocturnal proximal nocturnal dyspnea with gasping sensation and dysphagia and a sensation of her throat closing in on her.  This would happen more so at night but also in the daytime.  Then approximately 2 months ago had a bronchitis episode and since then has chronic cough that follows a similar pattern particularly at night.  It is associated with chest tightness and wheezing she was prescribed Advair but due to the expense has not taken it.  She is continued with her Singulair and Claritin.  She has seen Dr. Lucio Edward and GI.  At the time she started seeing her she was already on Prilosec 20 mg daily for a year.  Dec 20, 2017 she underwent endoscopy.  I personally visualized the report and it is normal.  She is status post esophageal dilatation which she says only helped for a few days but since then has not helped.  After the endoscopy her Prilosec has been doubled but the symptoms have remained unchanged particularly the cough.  Therefore referral to pulmonary was made.  She also referral with ENT pending at this stage.  She does continue to smoke.  Laughing and talking make her cough worse.  Cough is severe as described in the RSI cough score.  Drinking  water or trying to stay quiet can help her cough.  The exhaled nitric oxide today is normal.  Review of the chart shows in 2013 she had a chest x-ray that I personally visualized and is clear.  She is not taking any fish oil or ACE inhibitors.    01/23/2018 Follow up ; Cough /Asthma  Patient presents for a 3-week follow-up.  Patient was seen last visit for a pulmonary consult for chronic cough.  Patient was given a prednisone taper.  And instructed to control for triggers with Prilosec twice daily.  Voice rest.  She was set up for a high-resolution CT chest that showed no evidence of interstitial lung disease.  3 mm right upper lobe nodule noted.  CT chest in 1 year has been recommended Cough score/reflux symptom index decreased at 29 today. Says she is doing some better.  Cough has decreased but continues to have frequent dry cough that is intermittently productive with mucus.  Has frequent throat clearing and sinus drainage.  She remains on Prilosec twice daily for reflux and does seem to have some improvement since she had a esophageal dilatation. PFTs were done today that show normal lung function with FEV1 103%, ratio 82, FVC 99%, positive bronchodilator response, DLCO 80%   No Known Allergies   OV 03/29/2018  Chief Complaint  Patient presents with  . Follow-up    Pt states she had a bad spell x3 weeks with a cough that started 7/19 and ended around 8/9. Pt stated she was also coughing up mucus that was occ. clear to green     Marchelle Gearing presents for follow-up of chronic cough. Since I last saw her she went to [place] mid July 2019 and then got sick and had 2 weeks of worsening cough which is now returned to baseline.At this point in time she is compliant with Symbicort and Singulair and the RSI cough score is improved from 37-23. 23 is arecent baseline. She continues to be bothered by the cough with a lot of clearing of the throat. Cough is particularly worse when she lies downand  when she talks and when she laughs. Cough does not bother her much in the middle of the night although occasionally does do that. She cannot remember the prednisone taper that I gave her has helped. She works at [employer] and [employer] and has to talk a lot    [place] 05/14/2018  Subjective:  Patient ID: Marchelle Gearing, female , DOB: 1971/04/05 , age 47 y.o. , MRN: [id number] , ADDRESS: [address] [place] [address]   05/14/2018 -   Chief Complaint  Patient presents with  . Follow-up    Cough has improved but she does still have some triggers.      HPI ANNSLEE TERCERO 47 y.o. -presents for follow-up of chronic cough.  Last visit I end of August 2019.  RSI cough score is 23.  I started on gabapentin, asked her to take complete voice rest and referred her to voice rehab.  Of these she is only started gabapentin but that only at two third of the normal dose.  She is here to do voice rehab.  She confessed that she forgot about it.  She is taking voice rest intermittently on a day or 2 here and there.  With these measures cough is improved significantly to an RSI cough score of 14.  She also feels she is 50% better but not fully better.  She does feel the need to get better even further.  She has not had a flu shot but says she will have it with her primary care doctor with blood draws next month in November 2019.  There are no other new issues.    Dr Lorenza Cambridge Reflux Symptom Index (> 13-15 suggestive of LPR cough) Results for KIRSTAN, FENTRESS (MRN [id number]) as of 03/29/2018 09:14  Ref. Range 01/02/2018 09:45  Nitric Oxide Unknown 18    03/29/2018  05/14/2018   Hoarseness of problem with voice 3 3 1   Clearing  Of Throat 4 3 4   Excess throat mucus or feeling of post nasal drip 4 1 1   Difficulty swallowing food, liquid or tablets 5 2 1   Cough after eating or lying down 4 4 1   Breathing difficulties or choking episodes 5 4 2   Troublesome or annoying cough 5 3 1   Sensation of something  sticking in throat or lump in throat 4 2 0  Heartburn, chest pain, indigestion, or stomach acid coming up 3 1 3   TOTAL 37 23 14     ROS - per HPI     has a past medical history of Allergy, Anxiety, Asthma, Blood transfusion without reported diagnosis, Colon polyps, GERD (gastroesophageal reflux disease), Thyroid disease, and UTI (urinary tract infection).   reports that she has  been smoking cigarettes. She has a 3.75 pack-year smoking history. She has never used smokeless tobacco.  Past Surgical History:  Procedure Laterality Date  . ABDOMINAL SURGERY    . APPENDECTOMY    . BACK SURGERY    . CHOLECYSTECTOMY    . GANGLION CYST EXCISION     right wrist 1992  . HERNIA REPAIR      No Known Allergies  Immunization History  Administered Date(s) Administered  . Influenza Split 06/19/2017  . Pneumococcal Polysaccharide-23 01/08/2013    Family History  Problem Relation Age of Onset  . Heart attack Mother   . Hypertension Mother   . Colon cancer Father   . Diabetes Father   . Asthma Sister   . Stomach cancer Brother   . Stomach cancer Brother   . Esophageal cancer Neg Hx   . Rectal cancer Neg Hx      Current Outpatient Medications:  .  albuterol (PROVENTIL HFA;VENTOLIN HFA) 108 (90 BASE) MCG/ACT inhaler, Inhale 2 puffs into the lungs every 6 (six) hours as needed. Wheezing/sob, Disp: , Rfl:  .  albuterol (PROVENTIL) (2.5 MG/3ML) 0.083% nebulizer solution, Take 3 mLs (2.5 mg total) by nebulization every 6 (six) hours as needed for wheezing or shortness of breath., Disp: 75 mL, Rfl: 3 .  benzonatate (TESSALON) 200 MG capsule, Take 1 capsule (200 mg total) by mouth 3 (three) times daily as needed for cough., Disp: 30 capsule, Rfl: 1 .  gabapentin (NEURONTIN) 300 MG capsule, TAKE 1 CAPSULE EVERY DAY FOR 1WK, THEN TWICE A DAY FOR 1WK, THEN 3X A DAY, Disp: 270 capsule, Rfl: 1 .  levothyroxine (SYNTHROID, LEVOTHROID) 100 MCG tablet, Take 100 mcg by mouth every morning. , Disp: ,  Rfl:  .  loratadine (CLARITIN) 10 MG tablet, , Disp: , Rfl:  .  montelukast (SINGULAIR) 10 MG tablet, Take 10 mg by mouth at bedtime., Disp: , Rfl:  .  omeprazole (PRILOSEC) 40 MG capsule, TAKE 1 CAPSULE BY MOUTH TWICE A DAY, Disp: 180 capsule, Rfl: 1 .  SYMBICORT 80-4.5 MCG/ACT inhaler, TAKE 2 PUFFS BY MOUTH TWICE A DAY, Disp: 1 Inhaler, Rfl: 5  Current Facility-Administered Medications:  .  0.9 %  sodium chloride infusion, 500 mL, Intravenous, Once, Ladene Artist, MD      Objective:   Vitals:   05/14/18 1518  BP: 110/70  Pulse: 80  SpO2: 97%  Weight: 149 lb 9.6 oz (67.9 kg)  Height: 5\' 4"  (1.626 m)    Estimated body mass index is 25.68 kg/m as calculated from the following:   Height as of this encounter: 5\' 4"  (1.626 m).   Weight as of this encounter: 149 lb 9.6 oz (67.9 kg).  @WEIGHTCHANGE @  [employer]   05/14/18 1518  Weight: 149 lb 9.6 oz (67.9 kg)     Physical Exam  General Appearance:    Alert, cooperative, no distress, appears stated age - yes , Deconditioned looking - no , OBESE  - no, Sitting on Wheelchair -  no  Head:    Normocephalic, without obvious abnormality, atraumatic  Eyes:    PERRL, conjunctiva/corneas clear,  Ears:    Normal TM's and external ear canals, both ears  Nose:   Nares normal, septum midline, mucosa normal, no drainage    or sinus tenderness. OXYGEN ON  - no . Patient is @ ra   Throat:   Lips, mucosa, and tongue normal; teeth and gums normal. Cyanosis on lips - no  Neck:  Supple, symmetrical, trachea midline, no adenopathy;    thyroid:  no enlargement/tenderness/nodules; no carotid   bruit or JVD  Back:     Symmetric, no curvature, ROM normal, no CVA tenderness  Lungs:     Distress - no , Wheeze no, Barrell Chest - no, Purse lip breathing - no, Crackles - no   Chest Wall:    No tenderness or deformity.    Heart:    Regular rate and rhythm, S1 and S2 normal, no rub   or gallop, Murmur - no  Breast Exam:    NOT DONE  Abdomen:      Soft, non-tender, bowel sounds active all four quadrants,    no masses, no organomegaly. Visceral obesity - no  Genitalia:   NOT DONE  Rectal:   NOT DONE  Extremities:   Extremities - normal, Has Cane - no, Clubbing - no, Edema - no  Pulses:   2+ and symmetric all extremities  Skin:   Stigmata of Connective Tissue Disease - no  Lymph nodes:   Cervical, supraclavicular, and axillary nodes normal  Psychiatric:  Neurologic:   Pleasant - yes, Anxious - no, Flat affect - no  CAm-ICU - neg, Alert and Oriented x 3 - yes, Moves all 4s - yes, Speech - normal, Cognition - intact           Assessment:       ICD-10-CM   1. 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 Brand Males, M.D., F.C.C.P,  Pulmonary and Critical Care Medicine Staff Physician, [place] Director - Interstitial Lung Disease  Program  Pulmonary [place] at [place], [place], [address]  Pager: [phone], If no answer or between  15:00h - 7:00h: call [phone] Telephone: [phone]  3:52 PM 05/14/2018

## 2018-05-15 ENCOUNTER — Ambulatory Visit: Payer: BLUE CROSS/BLUE SHIELD | Admitting: Internal Medicine

## 2018-06-05 ENCOUNTER — Ambulatory Visit: Payer: Self-pay

## 2018-06-21 ENCOUNTER — Ambulatory Visit: Payer: Self-pay

## 2018-07-09 ENCOUNTER — Ambulatory Visit: Payer: BLUE CROSS/BLUE SHIELD | Admitting: Internal Medicine

## 2018-10-10 DIAGNOSIS — M67432 Ganglion, left wrist: Secondary | ICD-10-CM | POA: Insufficient documentation

## 2018-10-11 ENCOUNTER — Other Ambulatory Visit: Payer: Self-pay | Admitting: Internal Medicine

## 2018-11-06 ENCOUNTER — Other Ambulatory Visit: Payer: Self-pay | Admitting: Internal Medicine

## 2019-01-13 ENCOUNTER — Other Ambulatory Visit: Payer: Self-pay

## 2019-02-10 ENCOUNTER — Telehealth: Payer: Self-pay | Admitting: *Deleted

## 2019-02-10 NOTE — Telephone Encounter (Signed)

## 2019-02-11 ENCOUNTER — Other Ambulatory Visit: Payer: Self-pay

## 2019-02-11 ENCOUNTER — Encounter (INDEPENDENT_AMBULATORY_CARE_PROVIDER_SITE_OTHER): Payer: Self-pay

## 2019-02-11 ENCOUNTER — Ambulatory Visit (INDEPENDENT_AMBULATORY_CARE_PROVIDER_SITE_OTHER)
Admission: RE | Admit: 2019-02-11 | Discharge: 2019-02-11 | Disposition: A | Discharge status: Home / Self Care | Payer: Self-pay | Source: Ambulatory Visit | Attending: Adult Health | Admitting: Adult Health

## 2019-02-11 DIAGNOSIS — R918 Other nonspecific abnormal finding of lung field: Secondary | ICD-10-CM

## 2019-02-12 ENCOUNTER — Telehealth: Payer: Self-pay | Admitting: Internal Medicine

## 2019-02-12 NOTE — Telephone Encounter (Signed)
Called and spoke with pt letting her know that TP said it was fine for [place] to schedule OV in office as long as she was not having any symptoms and had not been exposed to [place] and pt verbalized understanding.  appt has been scheduled for pt 7/14 with TP at 9am. Pt has been given new office address. Nothing further needed.

## 2019-02-12 NOTE — Telephone Encounter (Signed)
l °

## 2019-02-12 NOTE — Telephone Encounter (Signed)
Notes recorded by Melvenia Needles, NP on 02/11/2019 at 5:17 PM EDT  Pulmonary nodules are stable  Active smoker  Will need CT chest w/out contrast in 1 year -please set up   Needs routine office visit to review in detail , was suppose to be seen end of last year.     Called and spoke with pt letting her know the results of the CT and stated to her that we would repeat the CT in 1 year. Pt verbalized understanding. Stated to pt that we needed to get her scheduled for a follow up and pt verbalized understanding.  While going over all the COVID screen with pt, pt denied any cough, no loss of smell, no headache, no SOB, no diarrhea, no fever, no rash, no sore throat, no vomiting/diarrhea.  Pt has not had to be tested for COVID for any reasons.  When asked pt if she has done any recent travelling, pt stated she did travel to [place] 7/1-7/4 and went camping.    Tammy, due to pt's travel, please advise if you are okay for pt to come into the office for the appt or if we need to make it a televisit? Thanks!

## 2019-02-12 NOTE — Telephone Encounter (Signed)
Sounds okay to come in as long as she does not have sx or been exposed to a known positive COVID pat.

## 2019-02-18 ENCOUNTER — Other Ambulatory Visit: Payer: Self-pay

## 2019-02-18 ENCOUNTER — Encounter: Payer: Self-pay | Admitting: Adult Health

## 2019-02-18 ENCOUNTER — Ambulatory Visit: Payer: BC Managed Care – PPO | Admitting: Adult Health

## 2019-02-18 DIAGNOSIS — R911 Solitary pulmonary nodule: Secondary | ICD-10-CM | POA: Diagnosis not present

## 2019-02-18 DIAGNOSIS — J4530 Mild persistent asthma, uncomplicated: Secondary | ICD-10-CM | POA: Diagnosis not present

## 2019-02-18 DIAGNOSIS — J302 Other seasonal allergic rhinitis: Secondary | ICD-10-CM | POA: Diagnosis not present

## 2019-02-18 DIAGNOSIS — R059 Cough, unspecified: Secondary | ICD-10-CM

## 2019-02-18 DIAGNOSIS — R05 Cough: Secondary | ICD-10-CM | POA: Insufficient documentation

## 2019-02-18 DIAGNOSIS — R918 Other nonspecific abnormal finding of lung field: Secondary | ICD-10-CM

## 2019-02-18 NOTE — Assessment & Plan Note (Signed)
Upper airway cough syndrome work-up was unrevealing.  May have been secondary to upper airway irritation and irritability along with underlying asthma. Patient improved with treatment aimed at GERD and chronic rhinitis prevention.  She did receive gabapentin for short period time that seemed to help with her chronic cough.  She is now essentially cough free. Continue to and treatment at prevention with GERD and chronic rhinitis medications as needed

## 2019-02-18 NOTE — Assessment & Plan Note (Signed)
Well-controlled on her current regimen No changes

## 2019-02-18 NOTE — Assessment & Plan Note (Signed)
Mild persistent asthma.  Patient is doing well on Symbicort.  Pulmonary function testing showed no airflow obstruction or restriction.  She did have reversibility with bronchodilator response. Have encouraged her on smoking cessation.  We will continue on her current regimen  Plan  Patient Instructions  Continue on Symbicort 80 2 puffs twice daily, rinse after use Continue on Claritin 10 mg daily Continue on Singulair 10mg  daily.  Saline nasal rinses as needed Work on not smoking CT chest without contrast in 1 year .  Follow-up with Dr. Chase Caller or Parrett NP in 1 year and as needed Please contact office for sooner follow up if symptoms do not improve or worsen or seek emergency care

## 2019-02-18 NOTE — Progress Notes (Signed)
@Patient  ID: Mikayla Richardson, female    DOB: 1970/10/02, 48 y.o.   MRN: [id number]  Chief Complaint  Patient presents with  . Follow-up    Lung nodule     Referring provider: No ref. provider found  HPI: 48 year old female active smoker seen for pulmonary consult Jan 02 2018 for chronic cough.  CT chest noted a lung nodule Past medical history significant for asthma and GERD  TEST/EVENTS :  Pulmonary function testing done June 2019 showed normal lung function with an FEV1 at 103%, ratio 82, FVC 99%, positive bronchodilator response.,  DLCO 80%.  High-resolution CT chest January 11, 2018 showed no evidence of interstitial lung disease.  Solid 3 mm apical right upper lobe nodule.   02/18/2019 follow-up cough and lung nodule Patient returns for a 23-month follow-up.  She has been followed for chronic cough, asthma and lung nodule.  She was previously treated with gabapentin for upper airway cough.  Along with GERD and chronic rhinitis prevention treatment regimen.  She says she took gabapentin for short period of time has improvement in cough.  She is no longer taking this.  She says her cough is doing better she intermittently has a dry cough.  She does not feel that this is bothersome.  She is on Symbicort for underlying asthma.  Says that her breathing has been doing well.  She denies any wheezing, shortness of breath or increased albuterol use.  She says she is active.  Works full-time.  Unfortunately she continues to smoke.  Smoking cessation was discussed in detail.  She does not have any desire to quit currently. During patient's work-up for underlying cough.  She underwent a high-resolution CT chest that showed no evidence of interstitial lung disease.  A solid 3 mm apical right upper lobe nodule.  She was recommend to have a follow-up CT chest that was done on February 11, 2019 that showed a stable 3 mm right upper lobe lung nodule.  We discussed these test results.  Due to her underlying smoking we  will do a follow-up CT chest in 1 year   No Known Allergies  Immunization History  Administered Date(s) Administered  . Influenza Split 06/19/2017  . Influenza,inj,Quad PF,6+ Mos 05/14/2018  . Pneumococcal Polysaccharide-23 01/08/2013    Past Medical History:  Diagnosis Date  . Allergy   . Anxiety   . Asthma   . Blood transfusion without reported diagnosis    1991 after childbirth  . Colon polyps   . GERD (gastroesophageal reflux disease)   . Thyroid disease   . UTI (urinary tract infection)     Tobacco History: Social History   Tobacco Use  Smoking Status Current Every Day Smoker  . Packs/day: 0.25  . Years: 15.00  . Pack years: 3.75  . Types: Cigarettes  Smokeless Tobacco Never Used  Tobacco Comment   8 cigarettes/day 05/14/2018   Ready to quit: No Counseling given: Yes Comment: 8 cigarettes/day 05/14/2018   Outpatient Medications Prior to Visit  Medication Sig Dispense Refill  . albuterol (PROVENTIL HFA;VENTOLIN HFA) 108 (90 BASE) MCG/ACT inhaler Inhale 2 puffs into the lungs every 6 (six) hours as needed. Wheezing/sob    . albuterol (PROVENTIL) (2.5 MG/3ML) 0.083% nebulizer solution Take 3 mLs (2.5 mg total) by nebulization every 6 (six) hours as needed for wheezing or shortness of breath. 75 mL 3  . gabapentin (NEURONTIN) 300 MG capsule Take 1 capsule (300 mg total) by mouth 3 (three) times daily. 270 capsule 1  .  levothyroxine (SYNTHROID, LEVOTHROID) 100 MCG tablet Take 100 mcg by mouth every morning.     . loratadine (CLARITIN) 10 MG tablet     . montelukast (SINGULAIR) 10 MG tablet Take 10 mg by mouth at bedtime.    Marland Kitchen omeprazole (PRILOSEC) 40 MG capsule TAKE 1 CAPSULE BY MOUTH TWICE A DAY 180 capsule 1  . SYMBICORT 80-4.5 MCG/ACT inhaler TAKE 2 PUFFS BY MOUTH TWICE A DAY 10.2 Inhaler 1   Facility-Administered Medications Prior to Visit  Medication Dose Route Frequency Provider Last Rate Last Dose  . 0.9 %  sodium chloride infusion  500 mL Intravenous  Once Ladene Artist, MD         Review of Systems:   Constitutional:   No  weight loss, night sweats,  Fevers, chills, fatigue, or  lassitude.  HEENT:   No headaches,  Difficulty swallowing,  Tooth/dental problems, or  Sore throat,                No sneezing, itching, ear ache, nasal congestion, post nasal drip,   CV:  No chest pain,  Orthopnea, PND, swelling in lower extremities, anasarca, dizziness, palpitations, syncope.   GI  No heartburn, indigestion, abdominal pain, nausea, vomiting, diarrhea, change in bowel habits, loss of appetite, bloody stools.   Resp: No shortness of breath with exertion or at rest.  No excess mucus, no productive cough,  No non-productive cough,  No coughing up of blood.  No change in color of mucus.  No wheezing.  No chest wall deformity  Skin: no rash or lesions.  GU: no dysuria, change in color of urine, no urgency or frequency.  No flank pain, no hematuria   MS:  No joint pain or swelling.  No decreased range of motion.  No back pain.    Physical Exam  BP 112/78 (BP Location: Left Arm, Cuff Size: Normal)   Pulse 79   Temp 98.3 F (36.8 C) (Oral)   Ht 5\' 4"  (1.626 m)   Wt 154 lb (69.9 kg)   LMP 06/04/2012   SpO2 98%   BMI 26.43 kg/m   GEN: A/Ox3; pleasant , NAD, well nourished    HEENT:  [place]/AT,  EACs-clear, TMs-wnl, NOSE-clear, THROAT-clear, no lesions, no postnasal drip or exudate noted.   NECK:  Supple w/ fair ROM; no JVD; normal carotid impulses w/o bruits; no thyromegaly or nodules palpated; no lymphadenopathy.    RESP  Clear  P & A; w/o, wheezes/ rales/ or rhonchi. no accessory muscle use, no dullness to percussion  CARD:  RRR, no m/r/g, no peripheral edema, pulses intact, no cyanosis or clubbing.  GI:   Soft & nt; nml bowel sounds; no organomegaly or masses detected.   Musco: Warm bil, no deformities or joint swelling noted.   Neuro: alert, no focal deficits noted.    Skin: Warm, no lesions or rashes    Lab Results:   CBC    Component Value Date/Time   WBC 8.1 08/04/2012 2013   RBC 4.40 08/04/2012 2013   HGB 13.0 08/04/2012 2013   HCT 38.9 08/04/2012 2013   PLT 288 08/04/2012 2013   MCV 88.4 08/04/2012 2013   MCH 29.5 08/04/2012 2013   MCHC 33.4 08/04/2012 2013   RDW 14.9 08/04/2012 2013   LYMPHSABS 2.5 08/04/2012 2013   MONOABS 0.6 08/04/2012 2013   EOSABS 0.6 08/04/2012 2013   BASOSABS 0.1 08/04/2012 2013    BMET    Component Value Date/Time   NA 137  08/04/2012 2013   K 4.1 08/04/2012 2013   CL 103 08/04/2012 2013   CO2 27 08/04/2012 2013   GLUCOSE 87 08/04/2012 2013   BUN 8 08/04/2012 2013   CREATININE 0.75 08/04/2012 2013   CALCIUM 8.8 08/04/2012 2013   GFRNONAA >90 08/04/2012 2013   GFRAA >90 08/04/2012 2013    BNP No results found for: BNP  ProBNP No results found for: PROBNP  Imaging: Ct Chest Wo Contrast  Result Date: 02/11/2019 CLINICAL DATA:  Follow up pulmonary nodule. No given personal history of malignancy. EXAM: CT CHEST WITHOUT CONTRAST TECHNIQUE: Multidetector CT imaging of the chest was performed following the standard protocol without IV contrast. COMPARISON:  Chest CT 01/10/2018. FINDINGS: Cardiovascular: Stable minimal atherosclerosis of the aorta and coronary arteries. No acute vascular findings on noncontrast imaging. The heart size is normal. There is a small amount of pericardial thickening or fluid which is unchanged. Mediastinum/Nodes: There are no enlarged mediastinal, hilar or axillary lymph nodes.Hilar assessment is limited by the lack of intravenous contrast, although the hilar contours appear unchanged. The thyroid gland, trachea and esophagus demonstrate no significant findings. Lungs/Pleura: There is no pleural effusion or pneumothorax. Scattered tiny pulmonary nodules bilaterally are stable, including the 3 mm right apical lesion (image 19/3). Some of these are calcified, consistent with granulomas. No new, enlarging or suspicious pulmonary nodules.  Stable minimal biapical scarring. Mild subsegmental atelectasis at both bases. Upper abdomen: The visualized upper abdomen appears stable without significant findings. Musculoskeletal/Chest wall: There is no chest wall mass or suspicious osseous finding. IMPRESSION: 1. Stable tiny bilateral pulmonary nodules consistent with benign findings. Per consensus criteria, no additional follow-up necessary. This recommendation follows the consensus statement: Guidelines for Management of Small Pulmonary Nodules Detected on CT Images: From the Fleischner Society 2017; Radiology 2017; 284:228-243. 2. No acute findings. 3. Minimal coronary and Aortic Atherosclerosis (ICD10-I70.0). Electronically Signed   By: Richardean Sale M.D.   On: 02/11/2019 16:49      PFT Results Latest Ref Rng & Units 01/23/2018  FVC-Pre L 3.37  FVC-Predicted Pre % 93  FVC-Post L 3.60  FVC-Predicted Post % 99  Pre FEV1/FVC % % 78  Post FEV1/FCV % % 82  FEV1-Pre L 2.64  FEV1-Predicted Pre % 91  FEV1-Post L 2.96  DLCO UNC% % 80  DLCO COR %Predicted % 76  TLC L 5.93  TLC % Predicted % 115  RV % Predicted % 149    Lab Results  Component Value Date   NITRICOXIDE 18 01/02/2018        Assessment & Plan:   Cough Upper airway cough syndrome work-up was unrevealing.  May have been secondary to upper airway irritation and irritability along with underlying asthma. Patient improved with treatment aimed at GERD and chronic rhinitis prevention.  She did receive gabapentin for short period time that seemed to help with her chronic cough.  She is now essentially cough free. Continue to and treatment at prevention with GERD and chronic rhinitis medications as needed  Asthma Mild persistent asthma.  Patient is doing well on Symbicort.  Pulmonary function testing showed no airflow obstruction or restriction.  She did have reversibility with bronchodilator response. Have encouraged her on smoking cessation.  We will continue on her  current regimen  Plan  Patient Instructions  Continue on Symbicort 80 2 puffs twice daily, rinse after use Continue on Claritin 10 mg daily Continue on Singulair 10mg  daily.  Saline nasal rinses as needed Work on not smoking CT chest  without contrast in 1 year .  Follow-up with Dr. Chase Caller or Maelys Kinnick NP in 1 year and as needed Please contact office for sooner follow up if symptoms do not improve or worsen or seek emergency care       Allergic rhinitis Well-controlled on her current regimen No changes  Lung nodule Right apical 3 mm lung nodule in active smoker.  Follow-up serial CT chest x1 year has been stable.  Due to her smoking we will repeat a CT chest in July 2021.      Rexene Edison, NP 02/18/2019

## 2019-02-18 NOTE — Patient Instructions (Signed)
Continue on Symbicort 80 2 puffs twice daily, rinse after use Continue on Claritin 10 mg daily Continue on Singulair 10mg  daily.  Saline nasal rinses as needed Work on not smoking CT chest without contrast in 1 year .  Follow-up with Dr. Chase Caller or Hardie Veltre NP in 1 year and as needed Please contact office for sooner follow up if symptoms do not improve or worsen or seek emergency care

## 2019-02-18 NOTE — Assessment & Plan Note (Signed)
Right apical 3 mm lung nodule in active smoker.  Follow-up serial CT chest x1 year has been stable.  Due to her smoking we will repeat a CT chest in July 2021.

## 2019-02-20 NOTE — Addendum Note (Signed)
Addended by: Collier Salina on: 02/20/2019 09:06 AM   Modules accepted: Orders

## 2019-05-29 ENCOUNTER — Other Ambulatory Visit: Payer: Self-pay | Admitting: Cardiology

## 2019-05-29 DIAGNOSIS — Z20822 Contact with and (suspected) exposure to covid-19: Secondary | ICD-10-CM

## 2019-05-31 LAB — NOVEL CORONAVIRUS, NAA: SARS-CoV-2, NAA: NOT DETECTED

## 2019-11-12 ENCOUNTER — Other Ambulatory Visit: Payer: Self-pay | Admitting: Internal Medicine

## 2019-11-25 ENCOUNTER — Telehealth: Payer: Self-pay | Admitting: Internal Medicine

## 2019-11-25 MED ORDER — ALBUTEROL SULFATE HFA 108 (90 BASE) MCG/ACT IN AERS: 2.0000 | INHALATION_SPRAY | Freq: Four times a day (QID) | RESPIRATORY_TRACT | 4 refills | Status: DC | PRN

## 2019-11-25 NOTE — Telephone Encounter (Signed)
ATC pt, no answer. Left message for pt to call back.  Rx sent in to her pharmacy

## 2019-11-26 NOTE — Telephone Encounter (Signed)
Left message with pt letting her know that her medication has been refilled.

## 2020-02-11 ENCOUNTER — Other Ambulatory Visit: Payer: BC Managed Care – PPO

## 2020-02-17 NOTE — Progress Notes (Deleted)
@Patient  ID: Mikayla Richardson, female    DOB: [birth year], 49 y.o.   MRN: [id number]  No chief complaint on file.   Referring provider: No ref. provider found  HPI:  49 year old female current everyday smoker followed in our office for asthma, pulmonary nodule, cough, allergic rhinitis  PMH: Hypothyroidism, hyperlipidemia, GERD Smoker/ Smoking History: Current smoker Maintenance: Symbicort Pt of: Dr. Chase Caller  02/17/2020  - Visit   49 year old female current everyday smoker followed in our office for asthma, pulmonary nodule, cough and allergic rhinitis.  Patient completing 1 year follow-up with our office today.  Questionaires / Pulmonary Flowsheets:   ACT:  No flowsheet data found.  MMRC: No flowsheet data found.  Epworth:  No flowsheet data found.  Tests:   01/11/2018-CT chest high-res-no evident interstitial lung disease, solid 3 mm apical right upper lobe pulmonary nodule  02/11/2019-CT chest without contrast-tiny stable bilateral pulmonary nodules consistent with benign findings, no additional follow-up necessary, no acute findings  01/23/2018-pulmonary function tests-pulmonary function test-FVC 3.37 (93% predicted), postbronchodilator ratio 82, postbronchodilator FEV1 2.96 (103% predicted), positive bronchodilator response in FEV1, mid flow reversibility, DLCO 20.01 (80% predicted) FENO:  Lab Results  Component Value Date   NITRICOXIDE 18 01/02/2018    PFT: PFT Results Latest Ref Rng & Units 01/23/2018  FVC-Pre L 3.37  FVC-Predicted Pre % 93  FVC-Post L 3.60  FVC-Predicted Post % 99  Pre FEV1/FVC % % 78  Post FEV1/FCV % % 82  FEV1-Pre L 2.64  FEV1-Predicted Pre % 91  FEV1-Post L 2.96  DLCO UNC% % 80  DLCO COR %Predicted % 76  TLC L 5.93  TLC % Predicted % 115  RV % Predicted % 149    WALK:  No flowsheet data found.  Imaging: No results found.  Lab Results:  CBC    Component Value Date/Time   WBC 8.1 08/04/2012 2013   RBC 4.40 08/04/2012 2013    HGB 13.0 08/04/2012 2013   HCT 38.9 08/04/2012 2013   PLT 288 08/04/2012 2013   MCV 88.4 08/04/2012 2013   MCH 29.5 08/04/2012 2013   MCHC 33.4 08/04/2012 2013   RDW 14.9 08/04/2012 2013   LYMPHSABS 2.5 08/04/2012 2013   MONOABS 0.6 08/04/2012 2013   EOSABS 0.6 08/04/2012 2013   BASOSABS 0.1 08/04/2012 2013    BMET    Component Value Date/Time   NA 137 08/04/2012 2013   K 4.1 08/04/2012 2013   CL 103 08/04/2012 2013   CO2 27 08/04/2012 2013   GLUCOSE 87 08/04/2012 2013   BUN 8 08/04/2012 2013   CREATININE 0.75 08/04/2012 2013   CALCIUM 8.8 08/04/2012 2013   GFRNONAA >90 08/04/2012 2013   GFRAA >90 08/04/2012 2013    BNP No results found for: BNP  ProBNP No results found for: PROBNP  Specialty Problems      Pulmonary Problems   Asthma    Overview:  Overview:  IMPRESSION: Improving, but still bothersome to the point of missing a couple of days work in past 2 wks. Add Singulair. gwt 04/11/2012 Overview:  IMPRESSION: Improving, but still bothersome to the point of missing a couple of days work in past 2 wks. Add Singulair. gwt 04/11/2012      Allergic rhinitis   Lung nodule    CT chest 01/2018 >3 mm RUL >repeat CT chest 01/2019       Cough      No Known Allergies  Immunization History  Administered Date(s) Administered  . Influenza Split 06/19/2017  .  Influenza,inj,Quad PF,6+ Mos 05/14/2018  . Pneumococcal Polysaccharide-23 01/08/2013    Past Medical History:  Diagnosis Date  . Allergy   . Anxiety   . Asthma   . Blood transfusion without reported diagnosis    1991 after childbirth  . Colon polyps   . GERD (gastroesophageal reflux disease)   . Thyroid disease   . UTI (urinary tract infection)     Tobacco History: Social History   Tobacco Use  Smoking Status Current Every Day Smoker  . Packs/day: 0.25  . Years: 15.00  . Pack years: 3.75  . Types: Cigarettes  Smokeless Tobacco Never Used  Tobacco Comment   8 cigarettes/day 05/14/2018   Ready  to quit: Not Answered Counseling given: Not Answered Comment: 8 cigarettes/day 05/14/2018   Continue to not smoke  Outpatient Encounter Medications as of 02/18/2020  Medication Sig  . albuterol (PROVENTIL) (2.5 MG/3ML) 0.083% nebulizer solution Take 3 mLs (2.5 mg total) by nebulization every 6 (six) hours as needed for wheezing or shortness of breath.  Marland Kitchen albuterol (VENTOLIN HFA) 108 (90 Base) MCG/ACT inhaler Inhale 2 puffs into the lungs every 6 (six) hours as needed. Wheezing/sob  . gabapentin (NEURONTIN) 300 MG capsule Take 1 capsule (300 mg total) by mouth 3 (three) times daily.  Marland Kitchen levothyroxine (SYNTHROID, LEVOTHROID) 100 MCG tablet Take 100 mcg by mouth every morning.   . loratadine (CLARITIN) 10 MG tablet   . montelukast (SINGULAIR) 10 MG tablet Take 10 mg by mouth at bedtime.  Marland Kitchen omeprazole (PRILOSEC) 40 MG capsule TAKE 1 CAPSULE BY MOUTH TWICE A DAY  . SYMBICORT 80-4.5 MCG/ACT inhaler INHALE 2 INHALATIONS TWICE DAILY   Facility-Administered Encounter Medications as of 02/18/2020  Medication  . 0.9 %  sodium chloride infusion     Review of Systems  Review of Systems   Physical Exam  LMP 06/04/2012   Wt Readings from Last 5 Encounters:  02/18/19 154 lb (69.9 kg)  05/14/18 149 lb 9.6 oz (67.9 kg)  03/29/18 148 lb (67.1 kg)  01/23/18 144 lb (65.3 kg)  01/02/18 144 lb (65.3 kg)    BMI Readings from Last 5 Encounters:  02/18/19 26.43 kg/m  05/14/18 25.68 kg/m  03/29/18 25.40 kg/m  01/23/18 23.60 kg/m  01/02/18 24.72 kg/m     Physical Exam    Assessment & Plan:   No problem-specific Assessment & Plan notes found for this encounter.    No follow-ups on file.   Lauraine Rinne, NP 02/17/2020   This appointment required *** minutes of patient care (this includes precharting, chart review, review of results, face-to-face care, etc.).

## 2020-02-18 ENCOUNTER — Ambulatory Visit: Payer: BC Managed Care – PPO | Admitting: Adult Health

## 2020-02-18 ENCOUNTER — Ambulatory Visit: Payer: BC Managed Care – PPO | Admitting: Pulmonary Disease

## 2020-02-20 ENCOUNTER — Encounter: Payer: Self-pay | Admitting: Gastroenterology

## 2020-03-05 ENCOUNTER — Ambulatory Visit (INDEPENDENT_AMBULATORY_CARE_PROVIDER_SITE_OTHER)
Admission: RE | Admit: 2020-03-05 | Discharge: 2020-03-05 | Disposition: A | Discharge status: Home / Self Care | Payer: BC Managed Care – PPO | Source: Ambulatory Visit | Attending: Adult Health | Admitting: Adult Health

## 2020-03-05 ENCOUNTER — Other Ambulatory Visit: Payer: Self-pay

## 2020-03-05 DIAGNOSIS — R911 Solitary pulmonary nodule: Secondary | ICD-10-CM

## 2020-03-05 DIAGNOSIS — R918 Other nonspecific abnormal finding of lung field: Secondary | ICD-10-CM | POA: Diagnosis not present

## 2020-03-15 ENCOUNTER — Telehealth: Payer: Self-pay | Admitting: Adult Health

## 2020-03-15 NOTE — Telephone Encounter (Signed)
Spoke with patient regarding CT Chest result. She is now scheduled for an appointment with Tammy on 03/26/20 at 10am. They verbalized understanding. No further questions.  Nothing further needed at this time.

## 2020-03-26 ENCOUNTER — Ambulatory Visit: Payer: BC Managed Care – PPO | Admitting: Adult Health

## 2020-03-30 ENCOUNTER — Ambulatory Visit: Payer: BC Managed Care – PPO | Admitting: Adult Health

## 2020-04-19 ENCOUNTER — Encounter: Payer: Self-pay | Admitting: Adult Health

## 2020-04-19 ENCOUNTER — Ambulatory Visit (INDEPENDENT_AMBULATORY_CARE_PROVIDER_SITE_OTHER): Payer: BC Managed Care – PPO | Admitting: Adult Health

## 2020-04-19 ENCOUNTER — Other Ambulatory Visit: Payer: Self-pay

## 2020-04-19 DIAGNOSIS — J4530 Mild persistent asthma, uncomplicated: Secondary | ICD-10-CM | POA: Diagnosis not present

## 2020-04-19 DIAGNOSIS — R911 Solitary pulmonary nodule: Secondary | ICD-10-CM | POA: Diagnosis not present

## 2020-04-19 DIAGNOSIS — F17200 Nicotine dependence, unspecified, uncomplicated: Secondary | ICD-10-CM | POA: Diagnosis not present

## 2020-04-19 DIAGNOSIS — J302 Other seasonal allergic rhinitis: Secondary | ICD-10-CM

## 2020-04-19 MED ORDER — FLUTICASONE-SALMETEROL 100-50 MCG/DOSE IN AEPB
1.0000 | INHALATION_SPRAY | Freq: Two times a day (BID) | RESPIRATORY_TRACT | 3 refills | Status: DC
Start: 2020-04-19 — End: 2021-01-24

## 2020-04-19 NOTE — Assessment & Plan Note (Signed)
Smoking cessation discussed 

## 2020-04-19 NOTE — Assessment & Plan Note (Signed)
Excellent control on current regimen  Plan  Patient Instructions  Stop Symbicort begin Advair 100 1 puff Twice daily   Continue on Claritin 10 mg daily Continue on Singulair 10mg  daily.  Saline nasal rinses as needed Work on not smoking Follow-up with Dr. Chase Caller or Alline Pio NP in 1 year and as needed Please contact office for sooner follow up if symptoms do not improve or worsen or seek emergency care

## 2020-04-19 NOTE — Assessment & Plan Note (Signed)
Continue on current regimen .   

## 2020-04-19 NOTE — Assessment & Plan Note (Signed)
Serial CT chest over the last 2 years showed no significant change lung nodules under 4 mm or less.  Consistent with a benign etiology. Discussed low-dose CT screening program once patient turns 53.

## 2020-04-19 NOTE — Patient Instructions (Signed)
Stop Symbicort begin Advair 100 1 puff Twice daily   Continue on Claritin 10 mg daily Continue on Singulair 10mg  daily.  Saline nasal rinses as needed Work on not smoking Follow-up with Dr. Chase Caller or Shamicka Inga NP in 1 year and as needed Please contact office for sooner follow up if symptoms do not improve or worsen or seek emergency care

## 2020-04-19 NOTE — Progress Notes (Signed)
@Patient  ID: Mikayla Richardson, female    DOB: 09/13/1970, 49 y.o.   MRN: [id number]  Chief Complaint  Patient presents with  . Follow-up    Cough     Referring provider: No ref. provider found  HPI: 49 year old female active smoker seen for pulmonary consult Jan 02, 2018 for chronic cough. Previous CT chest showed a small lung nodule. Past medical history significant for asthma and GERD   TEST/EVENTS :  Pulmonary function testing done June 2019 showed normal lung function with an FEV1 at 103%, ratio 82, FVC 99%, positive bronchodilator response.,  DLCO 80%.  High-resolution CT chest January 11, 2018 showed no evidence of interstitial lung disease.  Solid 3 mm apical right upper lobe nodule.  CT chest March 05, 2020 showed biapical pleural-parenchymal scarring, mild emphysema, noncalcified pulmonary nodules measuring 4 mm or less.  Unchanged and consistent with benign etiology.  04/19/2020 Follow up : Cough and Lung nodule  Patient returns for a 1 year follow-up.  Patient has underlying chronic cough asthma and lung nodule.  She has previously been treated with gabapentin for upper airway cough syndrome.  Along with GERD and chronic rhinitis prevention.  Overall she says her cough is doing well , no flare of cough or wheezing .  Patient remains on Symbicort twice daily.  But says her insurance is no longer covering well.  And is not affordable. Remains on Claritin and Singulair . Says allergies doing well .  Wants to change to Advair, says she has been on before and did well. This is covered on her insurance.   Patient has known lung nodules noted on CT chest.  She is had serial follow-up over the last 2 years with no significant change in pulmonary nodules measuring 4 mm or less consistent with a benign etiology.   Had Covid last month , fully vaccinated. Had sinus symptoms only . Lost smell.  No cough or breathing issues.    No Known Allergies  Immunization History  Administered  Date(s) Administered  . Influenza Split 06/19/2017  . Influenza,inj,Quad PF,6+ Mos 05/14/2018  . Pneumococcal Polysaccharide-23 01/08/2013    Past Medical History:  Diagnosis Date  . Allergy   . Anxiety   . Asthma   . Blood transfusion without reported diagnosis    1991 after childbirth  . Colon polyps   . GERD (gastroesophageal reflux disease)   . Thyroid disease   . UTI (urinary tract infection)     Tobacco History: Social History   Tobacco Use  Smoking Status Current Every Day Smoker  . Packs/day: 1.00  . Years: 15.00  . Pack years: 15.00  . Types: Cigarettes  Smokeless Tobacco Never Used  Tobacco Comment   8 cigarettes/day 05/14/2018   Ready to quit: No Counseling given: Not Answered Comment: 8 cigarettes/day 05/14/2018   Outpatient Medications Prior to Visit  Medication Sig Dispense Refill  . albuterol (PROVENTIL) (2.5 MG/3ML) 0.083% nebulizer solution Take 3 mLs (2.5 mg total) by nebulization every 6 (six) hours as needed for wheezing or shortness of breath. 75 mL 3  . albuterol (VENTOLIN HFA) 108 (90 Base) MCG/ACT inhaler Inhale 2 puffs into the lungs every 6 (six) hours as needed. Wheezing/sob 8 g 4  . gabapentin (NEURONTIN) 300 MG capsule Take 1 capsule (300 mg total) by mouth 3 (three) times daily. 270 capsule 1  . levothyroxine (SYNTHROID, LEVOTHROID) 100 MCG tablet Take 100 mcg by mouth every morning.     . loratadine (CLARITIN) 10 MG  tablet     . montelukast (SINGULAIR) 10 MG tablet Take 10 mg by mouth at bedtime.    Marland Kitchen omeprazole (PRILOSEC) 40 MG capsule TAKE 1 CAPSULE BY MOUTH TWICE A DAY 180 capsule 1  . SYMBICORT 80-4.5 MCG/ACT inhaler INHALE 2 INHALATIONS TWICE DAILY (Patient not taking: Reported on 04/19/2020) 10.2 Inhaler 5   Facility-Administered Medications Prior to Visit  Medication Dose Route Frequency Provider Last Rate Last Admin  . 0.9 %  sodium chloride infusion  500 mL Intravenous Once Ladene Artist, MD         Review of Systems:    Constitutional:   No  weight loss, night sweats,  Fevers, chills, fatigue, or  lassitude.  HEENT:   No headaches,  Difficulty swallowing,  Tooth/dental problems, or  Sore throat,                No sneezing, itching, ear ache, nasal congestion, post nasal drip,   CV:  No chest pain,  Orthopnea, PND, swelling in lower extremities, anasarca, dizziness, palpitations, syncope.   GI  No heartburn, indigestion, abdominal pain, nausea, vomiting, diarrhea, change in bowel habits, loss of appetite, bloody stools.   Resp: No shortness of breath with exertion or at rest.  No excess mucus, no productive cough,  No non-productive cough,  No coughing up of blood.  No change in color of mucus.  No wheezing.  No chest wall deformity  Skin: no rash or lesions.  GU: no dysuria, change in color of urine, no urgency or frequency.  No flank pain, no hematuria   MS:  No joint pain or swelling.  No decreased range of motion.  No back pain.    Physical Exam  BP 118/66 (BP Location: Left Arm, Cuff Size: Normal)   Pulse 76   Temp 98.2 F (36.8 C) (Temporal)   Ht 5\' 4"  (1.626 m)   Wt 159 lb (72.1 kg)   LMP 06/04/2012   SpO2 94% Comment: RA  BMI 27.29 kg/m   GEN: A/Ox3; pleasant , NAD, well nourished    HEENT:  [place]/AT,    NOSE-clear, THROAT-clear, no lesions, no postnasal drip or exudate noted.   NECK:  Supple w/ fair ROM; no JVD; normal carotid impulses w/o bruits; no thyromegaly or nodules palpated; no lymphadenopathy.    RESP  Clear  P & A; w/o, wheezes/ rales/ or rhonchi. no accessory muscle use, no dullness to percussion  CARD:  RRR, no m/r/g, no peripheral edema, pulses intact, no cyanosis or clubbing.  GI:   Soft & nt; nml bowel sounds; no organomegaly or masses detected.   Musco: Warm bil, no deformities or joint swelling noted.   Neuro: alert, no focal deficits noted.    Skin: Warm, no lesions or rashes    Lab Results:  BNP No results found for: BNP  ProBNP No results found  for: PROBNP  Imaging: No results found.    PFT Results Latest Ref Rng & Units 01/23/2018  FVC-Pre L 3.37  FVC-Predicted Pre % 93  FVC-Post L 3.60  FVC-Predicted Post % 99  Pre FEV1/FVC % % 78  Post FEV1/FCV % % 82  FEV1-Pre L 2.64  FEV1-Predicted Pre % 91  FEV1-Post L 2.96  DLCO uncorrected ml/min/mmHg 20.01  DLCO UNC% % 80  DLVA Predicted % 76  TLC L 5.93  TLC % Predicted % 115  RV % Predicted % 149    Lab Results  Component Value Date   NITRICOXIDE 18 01/02/2018  Assessment & Plan:   Asthma Excellent control on current regimen  Plan  Patient Instructions  Stop Symbicort begin Advair 100 1 puff Twice daily   Continue on Claritin 10 mg daily Continue on Singulair 10mg  daily.  Saline nasal rinses as needed Work on not smoking Follow-up with Dr. Chase Caller or Javon Snee NP in 1 year and as needed Please contact office for sooner follow up if symptoms do not improve or worsen or seek emergency care       Allergic rhinitis Continue on current regimen  Tobacco use disorder Smoking cessation discussed  Lung nodule Serial CT chest over the last 2 years showed no significant change lung nodules under 4 mm or less.  Consistent with a benign etiology. Discussed low-dose CT screening program once patient turns 53.     Rexene Edison, NP 04/19/2020

## 2020-04-21 ENCOUNTER — Encounter: Payer: Self-pay | Admitting: Gastroenterology

## 2020-04-21 ENCOUNTER — Ambulatory Visit (INDEPENDENT_AMBULATORY_CARE_PROVIDER_SITE_OTHER): Payer: BC Managed Care – PPO | Admitting: Gastroenterology

## 2020-04-21 VITALS — BP 119/62 | HR 78 | Ht 64.0 in | Wt 159.2 lb

## 2020-04-21 DIAGNOSIS — Z800 Family history of malignant neoplasm of digestive organs: Secondary | ICD-10-CM

## 2020-04-21 DIAGNOSIS — Z86010 Personal history of colonic polyps: Secondary | ICD-10-CM

## 2020-04-21 MED ORDER — NA SULFATE-K SULFATE-MG SULF 17.5-3.13-1.6 GM/177ML PO SOLN
1.0000 | Freq: Once | ORAL | 0 refills | Status: AC
Start: 2020-04-21 — End: 2020-04-21

## 2020-04-21 NOTE — Patient Instructions (Signed)
You have been scheduled for a colonoscopy. Please follow written instructions given to you at your visit today.  Please pick up your prep supplies at the pharmacy within the next 1-3 days. If you use inhalers (even only as needed), please bring them with you on the day of your procedure.  Thank you for choosing me and [facility].  Malcolm T. Stark, Jr., MD., FACG  

## 2020-04-21 NOTE — Progress Notes (Signed)
    History of Present Illness: This is a 49 year old female with a family history of colon cancer and a personal history of colon polyps.  Unfortunately we do not have reports from any of her prior colonoscopies.  She relates 2 or 3 colonoscopies performed at [facility] starting about 10 years ago.  She states precancerous polyps were found on her first 1 or 2 colonoscopies.  She had a repeat colonoscopy performed in the [place], [place] in March 2018 and she relates that exam was normal.  She does not recall where the procedure was performed or the gastroenterologist.  She relates that she was recommended to have a colonoscopy in 3 years.  She has no colorectal complaints.  Her father developed colon cancer at age 69.  She relates 2 brothers who have had stomach cancer.  She has chronic GERD that is currently well controlled.  EGD performed here in May 2019 for dysphagia, cough, throat tightness and weight loss was normal.  An empiric esophageal dilation was performed.  She relates occasional hemorrhoidal swelling and discomfort.  She has had occasional hemorrhoid prolapse when constipated and straining.   Current Medications, Allergies, Past Medical History, Past Surgical History, Family History and Social History were reviewed in [employer] record.   Physical Exam: General: Well developed, well nourished, no acute distress Head: Normocephalic and atraumatic Eyes:  sclerae anicteric, EOMI Ears: Normal auditory acuity Mouth: Not examined, mask on during Covid-19 pandemic Lungs: Clear throughout to auscultation Heart: Regular rate and rhythm; no murmurs, rubs or bruits Abdomen: Soft, non tender and non distended. No masses, hepatosplenomegaly or hernias noted. Normal Bowel sounds Rectal: Deferred to colonoscopy  Musculoskeletal: Symmetrical with no gross deformities  Pulses:  Normal pulses noted Extremities: No clubbing, cyanosis, edema or deformities  noted Neurological: Alert oriented x 4, grossly nonfocal Psychological:  Alert and cooperative. Normal mood and affect   Assessment and Recommendations:  1.  Family history of colon cancer in her father.  Personal history of precancerous colon polyps by patient history.  No records available today.  Request records from [facility] gastro[facility].  Schedule colonoscopy. The risks (including bleeding, perforation, infection, missed lesions, medication reactions and possible hospitalization or surgery if complications occur), benefits, and alternatives to colonoscopy with possible biopsy and possible polypectomy were discussed with the patient and they consent to proceed.   2.  Suspected hemorrhoids.  Further evaluation at colonoscopy.    3.  GERD.  Follow antireflux measures and continue omeprazole 40 mg twice daily.  Without additional information regarding her brothers' cancers I do not see an indication for future screening upper endoscopies based on her brothers history of stomach cancer.

## 2020-05-04 ENCOUNTER — Encounter: Payer: Self-pay | Admitting: Certified Registered Nurse Anesthetist

## 2020-05-05 ENCOUNTER — Ambulatory Visit (AMBULATORY_SURGERY_CENTER): Payer: BC Managed Care – PPO | Admitting: Gastroenterology

## 2020-05-05 ENCOUNTER — Other Ambulatory Visit: Payer: Self-pay

## 2020-05-05 ENCOUNTER — Encounter: Payer: Self-pay | Admitting: Gastroenterology

## 2020-05-05 VITALS — BP 116/80 | HR 74 | Temp 97.8°F | Resp 15

## 2020-05-05 DIAGNOSIS — D125 Benign neoplasm of sigmoid colon: Secondary | ICD-10-CM | POA: Diagnosis not present

## 2020-05-05 DIAGNOSIS — Z800 Family history of malignant neoplasm of digestive organs: Secondary | ICD-10-CM

## 2020-05-05 DIAGNOSIS — D123 Benign neoplasm of transverse colon: Secondary | ICD-10-CM

## 2020-05-05 DIAGNOSIS — Z86010 Personal history of colonic polyps: Secondary | ICD-10-CM

## 2020-05-05 DIAGNOSIS — D128 Benign neoplasm of rectum: Secondary | ICD-10-CM | POA: Diagnosis not present

## 2020-05-05 DIAGNOSIS — D120 Benign neoplasm of cecum: Secondary | ICD-10-CM

## 2020-05-05 DIAGNOSIS — D124 Benign neoplasm of descending colon: Secondary | ICD-10-CM | POA: Diagnosis not present

## 2020-05-05 MED ORDER — SODIUM CHLORIDE 0.9 % IV SOLN
500.0000 mL | Freq: Once | INTRAVENOUS | Status: DC
Start: 2020-05-05 — End: 2020-05-05

## 2020-05-05 NOTE — Progress Notes (Signed)
Report given to PACU, vss 

## 2020-05-05 NOTE — Progress Notes (Signed)
Called to room to assist during endoscopic procedure.  Patient ID and intended procedure confirmed with present staff. Received instructions for my participation in the procedure from the performing physician.  

## 2020-05-05 NOTE — Progress Notes (Signed)
VS by CW. ?

## 2020-05-05 NOTE — Patient Instructions (Signed)
Please read handouts provided. Continue present medications. Await pathology results. No aspirin, ibuprofen, naproxen, or other non-steriodal anti-inflammatory drugs for 2 weeks.    YOU HAD AN ENDOSCOPIC PROCEDURE TODAY AT THE [facility]:   Refer to the procedure report that was given to you for any specific questions about what was found during the examination.  If the procedure report does not answer your questions, please call your gastroenterologist to clarify.  If you requested that your care partner not be given the details of your procedure findings, then the procedure report has been included in a sealed envelope for you to review at your convenience later.  YOU SHOULD EXPECT: Some feelings of bloating in the abdomen. Passage of more gas than usual.  Walking can help get rid of the air that was put into your GI tract during the procedure and reduce the bloating. If you had a lower endoscopy (such as a colonoscopy or flexible sigmoidoscopy) you may notice spotting of blood in your stool or on the toilet paper. If you underwent a bowel prep for your procedure, you may not have a normal bowel movement for a few days.  Please Note:  You might notice some irritation and congestion in your nose or some drainage.  This is from the oxygen used during your procedure.  There is no need for concern and it should clear up in a day or so.  SYMPTOMS TO REPORT IMMEDIATELY:  Following lower endoscopy (colonoscopy or flexible sigmoidoscopy):  Excessive amounts of blood in the stool  Significant tenderness or worsening of abdominal pains  Swelling of the abdomen that is new, acute  Fever of 100F or higher   For urgent or emergent issues, a gastroenterologist can be reached at any hour by calling [phone]. Do not use MyChart messaging for urgent concerns.    DIET:  We do recommend a small meal at first, but then you may proceed to your regular diet.  Drink plenty of fluids but you  should avoid alcoholic beverages for 24 hours.  ACTIVITY:  You should plan to take it easy for the rest of today and you should NOT DRIVE or use heavy machinery until tomorrow (because of the sedation medicines used during the test).    FOLLOW UP: Our staff will call the number listed on your records 48-72 hours following your procedure to check on you and address any questions or concerns that you may have regarding the information given to you following your procedure. If we do not reach you, we will leave a message.  We will attempt to reach you two times.  During this call, we will ask if you have developed any symptoms of COVID 19. If you develop any symptoms (ie: fever, flu-like symptoms, shortness of breath, cough etc.) before then, please call [phone].  If you test positive for Covid 19 in the 2 weeks post procedure, please call and report this information to us.    If any biopsies were taken you will be contacted by phone or by letter within the next 1-3 weeks.  Please call us at [phone] if you have not heard about the biopsies in 3 weeks.    SIGNATURES/CONFIDENTIALITY: You and/or your care partner have signed paperwork which will be entered into your electronic medical record.  These signatures attest to the fact that that the information above on your After Visit Summary has been reviewed and is understood.  Full responsibility of the confidentiality of this discharge information   discharge information lies with you and/or your care-partner. 

## 2020-05-05 NOTE — Op Note (Signed)
[facility] Patient Name: Mikayla Richardson Procedure Date: 05/05/2020 11:07 AM MRN: [id number] Endoscopist: Ladene Artist , MD Age: 49 Referring MD:  Date of Birth: 1970-08-27 Gender: Female Account #: [id number] Procedure:                Colonoscopy Indications:              Surveillance: Personal history of colonic polyps                            (unknown histology) on last colonoscopy 3 years                            ago. Family history of colon cancer, first degree                            relative. Medicines:                Monitored Anesthesia Care Procedure:                Pre-Anesthesia Assessment:                           - Prior to the procedure, a History and Physical                            was performed, and patient medications and                            allergies were reviewed. The patient's tolerance of                            previous anesthesia was also reviewed. The risks                            and benefits of the procedure and the sedation                            options and risks were discussed with the patient.                            All questions were answered, and informed consent                            was obtained. Prior Anticoagulants: The patient has                            taken no previous anticoagulant or antiplatelet                            agents. ASA Grade Assessment: II - A patient with                            mild systemic disease. After reviewing the risks  and benefits, the patient was deemed in                            satisfactory condition to undergo the procedure.                           After obtaining informed consent, the colonoscope                            was passed under direct vision. Throughout the                            procedure, the patient's blood pressure, pulse, and                            oxygen saturations were monitored continuously. The                             Colonoscope was introduced through the anus and                            advanced to the the cecum, identified by                            appendiceal orifice and ileocecal valve. The                            ileocecal valve, appendiceal orifice, and rectum                            were photographed. The quality of the bowel                            preparation was good. The colonoscopy was performed                            without difficulty. The patient tolerated the                            procedure well. Scope In: 11:12:59 AM Scope Out: 11:39:05 AM Scope Withdrawal Time: 0 hours 22 minutes 40 seconds  Total Procedure Duration: 0 hours 26 minutes 6 seconds  Findings:                 The perianal and digital rectal examinations were                            normal.                           Thirteen sessile polyps were found in the rectum                            (4), sigmoid colon (2), descending colon (4),  transverse colon (1) and cecum (2). The polyps were                            5 to 8 mm in size. These polyps were removed with a                            cold snare. Resection and retrieval were complete.                           Internal hemorrhoids were found during                            retroflexion. The hemorrhoids were small and Grade                            I (internal hemorrhoids that do not prolapse).                           The exam was otherwise without abnormality on                            direct and retroflexion views. Complications:            No immediate complications. Estimated blood loss:                            None. Estimated Blood Loss:     Estimated blood loss: none. Impression:               - Thirteen 5 to 8 mm polyps in the rectum, in the                            sigmoid colon, in the descending colon, in the                            transverse colon and in the  cecum, removed with a                            cold snare. Resected and retrieved.                           - Internal hemorrhoids.                           - The examination was otherwise normal on direct                            and retroflexion views. Recommendation:           - Repeat colonoscopy date to be determined after                            pending pathology results are reviewed for  surveillance based on pathology results.                           - Patient has a contact number available for                            emergencies. The signs and symptoms of potential                            delayed complications were discussed with the                            patient. Return to normal activities tomorrow.                            Written discharge instructions were provided to the                            patient.                           - Resume previous diet.                           - Continue present medications.                           - Await pathology results.                           - No aspirin, ibuprofen, naproxen, or other                            non-steroidal anti-inflammatory drugs for 2 weeks                            after polyp removal. Ladene Artist, MD 05/05/2020 11:43:23 AM This report has been signed electronically.

## 2020-05-07 ENCOUNTER — Telehealth: Payer: Self-pay | Admitting: *Deleted

## 2020-05-07 NOTE — Telephone Encounter (Signed)
  Follow up Call-  Call back number 05/05/2020 12/20/2017  Post procedure Call Back phone  # [phone]  Permission to leave phone message Yes Yes  Some recent data might be hidden     Patient questions:  Do you have a fever, pain , or abdominal swelling? No. Pain Score  0 *  Have you tolerated food without any problems? Yes.    Have you been able to return to your normal activities? Yes.    Do you have any questions about your discharge instructions: Diet   No. Medications  No. Follow up visit  No.  Do you have questions or concerns about your Care? No.  Actions: * If pain score is 4 or above: 1. No action needed, pain <4.Have you developed a fever since your procedure? no  2.   Have you had an respiratory symptoms (SOB or cough) since your procedure? no  3.   Have you tested positive for COVID 19 since your procedure no  4.   Have you had any family members/close contacts diagnosed with the COVID 19 since your procedure?  no   If yes to any of these questions please route to Joylene John, RN and Joella Prince, RN

## 2020-05-18 ENCOUNTER — Encounter: Payer: Self-pay | Admitting: Gastroenterology

## 2020-05-23 DIAGNOSIS — G5762 Lesion of plantar nerve, left lower limb: Secondary | ICD-10-CM | POA: Insufficient documentation

## 2020-05-24 ENCOUNTER — Telehealth: Payer: Self-pay | Admitting: Gastroenterology

## 2020-05-24 NOTE — Telephone Encounter (Signed)
Left message for patient to call back  

## 2020-05-25 NOTE — Telephone Encounter (Signed)
Patient notified of the results.  All questions answered.  She will call back for any additional questions or concerns 

## 2020-05-26 ENCOUNTER — Other Ambulatory Visit: Payer: Self-pay | Admitting: Internal Medicine

## 2020-06-23 DIAGNOSIS — Z9889 Other specified postprocedural states: Secondary | ICD-10-CM | POA: Insufficient documentation

## 2020-07-21 DIAGNOSIS — J45909 Unspecified asthma, uncomplicated: Secondary | ICD-10-CM | POA: Diagnosis not present

## 2020-07-21 DIAGNOSIS — N390 Urinary tract infection, site not specified: Secondary | ICD-10-CM | POA: Insufficient documentation

## 2020-07-21 DIAGNOSIS — R310 Gross hematuria: Secondary | ICD-10-CM | POA: Diagnosis present

## 2020-07-21 DIAGNOSIS — Z79899 Other long term (current) drug therapy: Secondary | ICD-10-CM | POA: Diagnosis not present

## 2020-07-21 DIAGNOSIS — K219 Gastro-esophageal reflux disease without esophagitis: Secondary | ICD-10-CM | POA: Diagnosis not present

## 2020-07-21 DIAGNOSIS — F17210 Nicotine dependence, cigarettes, uncomplicated: Secondary | ICD-10-CM | POA: Diagnosis not present

## 2020-07-21 LAB — URINALYSIS, ROUTINE W REFLEX MICROSCOPIC
Bilirubin Urine: NEGATIVE
Glucose, UA: NEGATIVE mg/dL
Ketones, ur: NEGATIVE mg/dL
Nitrite: NEGATIVE
Protein, ur: 100 mg/dL — AB
RBC / HPF: >50 RBC/hpf — ABNORMAL HIGH (ref 0–5)
Specific Gravity, Urine: 1.008 (ref 1.005–1.030)
WBC, UA: >50 WBC/hpf — ABNORMAL HIGH (ref 0–5)
pH: 5 (ref 5.0–8.0)

## 2020-07-21 NOTE — ED Triage Notes (Signed)
Pt presents c/o dysuria starting today, hx UTI's in the past but states this is different, c/o chills and blood in urine starting today as well. Pt states she starting taking azo today.

## 2020-07-22 ENCOUNTER — Emergency Department (HOSPITAL_COMMUNITY)
Admission: EM | Admit: 2020-07-22 | Discharge: 2020-07-22 | Disposition: A | Discharge status: Home / Self Care | Payer: BC Managed Care – PPO | Attending: Emergency Medicine | Admitting: Emergency Medicine

## 2020-07-22 DIAGNOSIS — N390 Urinary tract infection, site not specified: Secondary | ICD-10-CM

## 2020-07-22 MED ORDER — CEPHALEXIN 500 MG PO CAPS
500.0000 mg | ORAL_CAPSULE | Freq: Two times a day (BID) | ORAL | 0 refills | Status: DC
Start: 2020-07-22 — End: 2021-03-07

## 2020-07-22 MED ORDER — STERILE WATER FOR INJECTION IJ SOLN
INTRAMUSCULAR | Status: AC
  Filled 2020-07-22: qty 10

## 2020-07-22 MED ORDER — CEFTRIAXONE SODIUM 1 G IJ SOLR
1.0000 g | Freq: Once | INTRAMUSCULAR | Status: AC
  Administered 2020-07-22: 01:00:00 1 g via INTRAMUSCULAR
  Filled 2020-07-22: qty 10

## 2020-07-22 MED ORDER — CEPHALEXIN 500 MG PO CAPS
500.0000 mg | ORAL_CAPSULE | Freq: Two times a day (BID) | ORAL | 0 refills | Status: DC
Start: 2020-07-22 — End: 2020-07-22

## 2020-07-22 NOTE — Discharge Instructions (Signed)
You may alternate Tylenol 1000 mg every 6 hours as needed for pain, fever and Ibuprofen 800 mg every 8 hours as needed for pain, fever.  Please take Ibuprofen with food.  Do not take more than 4000 mg of Tylenol (acetaminophen) in a 24 hour period.  

## 2020-07-22 NOTE — ED Provider Notes (Signed)
TIME SEEN: 12:39 AM  CHIEF COMPLAINT: Dysuria, hematuria  HPI: Patient is a 49 year old female with history of frequent UTIs who presents to the emergency department with concerns that she has another UTI.  Complaining of dysuria, gross hematuria that started at 6 PM tonight.  She has also had chills but no documented fever.  No nausea, vomiting or diarrhea.  Has had some suprapubic discomfort.  No vaginal bleeding or discharge.  ROS: See HPI Constitutional: no fever  Eyes: no drainage  ENT: no runny nose   Cardiovascular:  no chest pain  Resp: no SOB  GI: no vomiting GU: dysuria Integumentary: no rash  Allergy: no hives  Musculoskeletal: no leg swelling  Neurological: no slurred speech ROS otherwise negative  PAST MEDICAL HISTORY/PAST SURGICAL HISTORY:  Past Medical History:  Diagnosis Date  . Allergy   . Anxiety   . Asthma   . Blood transfusion without reported diagnosis    1991 after childbirth  . Colon polyps   . GERD (gastroesophageal reflux disease)   . Thyroid disease   . UTI (urinary tract infection)     MEDICATIONS:  Prior to Admission medications   Medication Sig Start Date End Date Taking? Authorizing Provider  albuterol (PROVENTIL) (2.5 MG/3ML) 0.083% nebulizer solution Take 3 mLs (2.5 mg total) by nebulization every 6 (six) hours as needed for wheezing or shortness of breath. 01/23/18   Parrett, Fonnie Mu, NP  albuterol (VENTOLIN HFA) 108 (90 Base) MCG/ACT inhaler INHALE 2 PUFFS BY MOUTH EVERY 6 HOURS AS NEEDED FOR WHEEZING/SHORTNESS OF BREATH 05/26/20   Brand Males, MD  Fluticasone-Salmeterol (ADVAIR DISKUS) 100-50 MCG/DOSE AEPB Inhale 1 puff into the lungs 2 (two) times daily. 04/19/20   Parrett, Fonnie Mu, NP  levothyroxine (SYNTHROID, LEVOTHROID) 100 MCG tablet Take 100 mcg by mouth every morning.     [provider]  loratadine (CLARITIN) 10 MG tablet  02/23/14   [provider]  montelukast (SINGULAIR) 10 MG tablet Take 10 mg by mouth at  bedtime.    [provider]  omeprazole (PRILOSEC) 40 MG capsule TAKE 1 CAPSULE BY MOUTH TWICE A DAY 03/18/18   Ladene Artist, MD    ALLERGIES:  No Known Allergies  SOCIAL HISTORY:  Social History   Tobacco Use  . Smoking status: Current Every Day Smoker    Packs/day: 1.00    Years: 15.00    Pack years: 15.00    Types: Cigarettes  . Smokeless tobacco: Never Used  . Tobacco comment: 8 cigarettes/day 05/14/2018  Substance Use Topics  . Alcohol use: Yes    Comment: rarely    FAMILY HISTORY: Family History  Problem Relation Age of Onset  . Heart attack Mother   . Hypertension Mother   . Colon cancer Father   . Diabetes Father   . Heart disease Father   . Asthma Sister   . Stomach cancer Brother   . Stomach cancer Brother   . Esophageal cancer Neg Hx   . Rectal cancer Neg Hx     EXAM: BP 128/88   Pulse 97   Temp 98.1 F (36.7 C)   Resp 16   Ht 5\' 4"  (1.626 m)   Wt 72.2 kg   LMP 06/04/2012   SpO2 99%   BMI 27.32 kg/m  CONSTITUTIONAL: Alert and oriented and responds appropriately to questions. Well-appearing; well-nourished HEAD: Normocephalic EYES: Conjunctivae clear, pupils appear equal, EOM appear intact ENT: normal nose; moist mucous membranes NECK: Supple, normal ROM CARD: RRR; S1  and S2 appreciated; no murmurs, no clicks, no rubs, no gallops RESP: Normal chest excursion without splinting or tachypnea; breath sounds clear and equal bilaterally; no wheezes, no rhonchi, no rales, no hypoxia or respiratory distress, speaking full sentences ABD/GI: Normal bowel sounds; non-distended; soft, non-tender, no rebound, no guarding, no peritoneal signs, no hepatosplenomegaly BACK:  The back appears normal EXT: Normal ROM in all joints; no deformity noted, no edema; no cyanosis SKIN: Normal color for age and race; warm; no rash on exposed skin NEURO: Moves all extremities equally PSYCH: The patient's mood and manner are appropriate.   MEDICAL DECISION  MAKING: Patient here with a UTI.  Culture pending.  No previous cultures in our system but states that Keflex has helped her before.  Will give dose of Rocephin here.  Discussed supportive care instructions and return precautions.  She is comfortable with this plan.  At this time, I do not feel there is any life-threatening condition present. I have reviewed, interpreted and discussed all results (EKG, imaging, lab, urine as appropriate) and exam findings with patient/family. I have reviewed nursing notes and appropriate previous records.  I feel the patient is safe to be discharged home without further emergent workup and can continue workup as an outpatient as needed. Discussed usual and customary return precautions. Patient/family verbalize understanding and are comfortable with this plan.  Outpatient follow-up has been provided as needed. All questions have been answered.      Mikayla Richardson was evaluated in Emergency Department on 07/22/2020 for the symptoms described in the history of present illness. She was evaluated in the context of the global COVID-19 pandemic, which necessitated consideration that the patient might be at risk for infection with the SARS-CoV-2 virus that causes COVID-19. Institutional protocols and algorithms that pertain to the evaluation of patients at risk for COVID-19 are in a state of rapid change based on information released by regulatory bodies including the CDC and federal and state organizations. These policies and algorithms were followed during the patient's care in the ED.      Kallon Caylor, Delice Bison, DO 07/22/20 0107

## 2020-07-23 LAB — URINE CULTURE: Culture: 40000 — AB

## 2020-07-24 ENCOUNTER — Telehealth (HOSPITAL_BASED_OUTPATIENT_CLINIC_OR_DEPARTMENT_OTHER): Payer: Self-pay | Admitting: Emergency Medicine

## 2020-07-24 NOTE — Telephone Encounter (Signed)
Post ED Visit - Positive Culture Follow-up  Culture report reviewed by antimicrobial stewardship pharmacist: [place] Team []  Elenor Quinones, Pharm.D. []  Heide Guile, Pharm.D., BCPS AQ-ID []  Parks Neptune, Pharm.D., BCPS []  Alycia Rossetti, Pharm.D., BCPS []  [place], Pharm.D., BCPS, AAHIVP []  Legrand Como, Pharm.D., BCPS, AAHIVP []  Salome Arnt, PharmD, BCPS []  Johnnette Gourd, PharmD, BCPS []  Hughes Better, PharmD, BCPS []  Leeroy Cha, PharmD []  Laqueta Linden, PharmD, BCPS []  Albertina Parr, PharmD  [place] Team []  Leodis Sias, PharmD []  Lindell Spar, PharmD []  Royetta Asal, PharmD []  Graylin Shiver, Rph []  Rema Fendt) Glennon Mac, PharmD []  Arlyn Dunning, PharmD []  Netta Cedars, PharmD []  Dia Sitter, PharmD []  Leone Haven, PharmD []  Gretta Arab, PharmD [x]  Ulice Dash, PharmD []  Peggyann Juba, PharmD []  Reuel Boom, PharmD   Positive urine culture Treated with Cephalexin, organism sensitive to the same and no further patient follow-up is required at this time.  Sandi Raveling Lamira Borin 07/24/2020, 5:35 PM

## 2020-10-24 ENCOUNTER — Other Ambulatory Visit: Payer: Self-pay | Admitting: Internal Medicine

## 2020-10-25 DIAGNOSIS — M2021 Hallux rigidus, right foot: Secondary | ICD-10-CM | POA: Insufficient documentation

## 2021-01-24 ENCOUNTER — Telehealth: Payer: Self-pay | Admitting: Internal Medicine

## 2021-01-24 MED ORDER — BUDESONIDE-FORMOTEROL FUMARATE 80-4.5 MCG/ACT IN AERO: 2.0000 | INHALATION_SPRAY | Freq: Two times a day (BID) | RESPIRATORY_TRACT | 6 refills | Status: DC

## 2021-01-24 NOTE — Telephone Encounter (Signed)
Ok to change

## 2021-01-24 NOTE — Telephone Encounter (Signed)
Spoke with pt. She states that her insurance no longer covers Advair brand name or Generic. Pt would like to switch back to Symbicort. She was previously on 80 mg 2 puffs BID. Please advise is ok to switch.

## 2021-01-24 NOTE — Telephone Encounter (Signed)
Called and spoke with patient. She is aware that MR is ok with her going back to Symbicort. I have sent this into her pharmacy.   Nothing further needed.

## 2021-03-03 ENCOUNTER — Ambulatory Visit: Payer: 59 | Admitting: Podiatry

## 2021-03-07 ENCOUNTER — Other Ambulatory Visit: Payer: Self-pay

## 2021-03-07 ENCOUNTER — Ambulatory Visit: Payer: 59 | Admitting: Podiatry

## 2021-03-07 ENCOUNTER — Encounter: Payer: Self-pay | Admitting: Podiatry

## 2021-03-07 DIAGNOSIS — M21611 Bunion of right foot: Secondary | ICD-10-CM

## 2021-03-07 DIAGNOSIS — B351 Tinea unguium: Secondary | ICD-10-CM

## 2021-03-07 DIAGNOSIS — Z79899 Other long term (current) drug therapy: Secondary | ICD-10-CM

## 2021-03-07 NOTE — Patient Instructions (Signed)
Terbinafine tablets What is this medication? TERBINAFINE (TER bin a feen) is an antifungal medicine. It is used to treatcertain kinds of fungal or yeast infections. This medicine may be used for other purposes; ask your health care provider orpharmacist if you have questions. COMMON BRAND NAME(S): Lamisil, Terbinex What should I tell my care team before I take this medication? They need to know if you have any of these conditions: drink alcoholic beverages kidney disease liver disease an unusual or allergic reaction to terbinafine, other medicines, foods, dyes, or preservatives pregnant or trying to get pregnant breast-feeding How should I use this medication? Take this medicine by mouth with a full glass of water. Follow the directions on the prescription label. You can take this medicine with food or on an empty stomach. Take your medicine at regular intervals. Do not take your medicine more often than directed. Do not skip doses or stop your medicine early even ifyou feel better. Do not stop taking except on your doctor's advice. A special MedGuide will be given to you by the pharmacist with eachprescription and refill. Be sure to read this information carefully each time. Talk to your pediatrician regarding the use of this medicine in children.Special care may be needed. Overdosage: If you think you have taken too much of this medicine contact apoison control center or emergency room at once. NOTE: This medicine is only for you. Do not share this medicine with others. What if I miss a dose? If you miss a dose, take it as soon as you can. If it is almost time for yournext dose, take only that dose. Do not take double or extra doses. What may interact with this medication? Do not take this medicine with any of the following medications: thioridazine This medicine may also interact with the following medications: beta-blockers caffeine cimetidine cyclosporine medicines for depression,  anxiety, or psychotic disturbances medicines for fungal infections like fluconazole and ketoconazole medicines for irregular heartbeat like amiodarone, flecainide and propafenone rifampin warfarin This list may not describe all possible interactions. Give your health care provider a list of all the medicines, herbs, non-prescription drugs, or dietary supplements you use. Also tell them if you smoke, drink alcohol, or use illegaldrugs. Some items may interact with your medicine. What should I watch for while using this medication? Visit your doctor or health care provider regularly. Tell your doctor right away if you have nausea or vomiting, loss of appetite, stomach pain on your right upper side, yellow skin, dark urine, light stools, or are over tired. Some fungal infections need many weeks or months of treatment to cure. If you are taking this medicine for a long time, you will need to have important bloodwork done. This medicine may cause serious skin reactions. They can happen weeks to months after starting the medicine. Contact your health care provider right away if you notice fevers or flu-like symptoms with a rash. The rash may be red or purple and then turn into blisters or peeling of the skin. Or, you might notice a red rash with swelling of the face, lips or lymph nodes in your neck or underyour arms. What side effects may I notice from receiving this medication? Side effects that you should report to your doctor or health care professionalas soon as possible: allergic reactions like skin rash or hives, swelling of the face, lips, or tongue changes in vision dark urine fever or infection general ill feeling or flu-like symptoms light-colored stools loss of appetite, nausea rash, fever,   and swollen lymph nodes redness, blistering, peeling or loosening of the skin, including inside the mouth right upper belly pain unusually weak or tired yellowing of the eyes or skin Side effects that  usually do not require medical attention (report to yourdoctor or health care professional if they continue or are bothersome): changes in taste diarrhea hair loss muscle or joint pain stomach gas stomach upset This list may not describe all possible side effects. Call your doctor for medical advice about side effects. You may report side effects to FDA at1-800-FDA-1088. Where should I keep my medication? Keep out of the reach of children. Store at room temperature below 25 degrees C (77 degrees F). Protect fromlight. Throw away any unused medicine after the expiration date. NOTE: This sheet is a summary. It may not cover all possible information. If you have questions about this medicine, talk to your doctor, pharmacist, orhealth care provider.  2022 Elsevier/Gold Standard (2018-11-01 15:37:07)  

## 2021-03-08 LAB — CBC WITH DIFFERENTIAL/PLATELET
Absolute Monocytes: 594 cells/uL (ref 200–950)
Basophils Absolute: 59 cells/uL (ref 0–200)
Basophils Relative: 1.1 %
Eosinophils Absolute: 427 cells/uL (ref 15–500)
Eosinophils Relative: 7.9 %
HCT: 41.3 % (ref 35.0–45.0)
Hemoglobin: 13.8 g/dL (ref 11.7–15.5)
Lymphs Abs: 1636 cells/uL (ref 850–3900)
MCH: 30.5 pg (ref 27.0–33.0)
MCHC: 33.4 g/dL (ref 32.0–36.0)
MCV: 91.2 fL (ref 80.0–100.0)
MPV: 10.7 fL (ref 7.5–12.5)
Monocytes Relative: 11 %
Neutro Abs: 2684 cells/uL (ref 1500–7800)
Neutrophils Relative %: 49.7 %
Platelets: 254 10*3/uL (ref 140–400)
RBC: 4.53 10*6/uL (ref 3.80–5.10)
RDW: 13.5 % (ref 11.0–15.0)
Total Lymphocyte: 30.3 %
WBC: 5.4 10*3/uL (ref 3.8–10.8)

## 2021-03-08 LAB — HEPATIC FUNCTION PANEL
AG Ratio: 1.7 (calc) (ref 1.0–2.5)
ALT: 6 U/L (ref 6–29)
AST: 17 U/L (ref 10–35)
Albumin: 4.2 g/dL (ref 3.6–5.1)
Alkaline phosphatase (APISO): 82 U/L (ref 37–153)
Bilirubin, Direct: 0.1 mg/dL (ref 0.0–0.2)
Globulin: 2.5 g/dL (calc) (ref 1.9–3.7)
Indirect Bilirubin: 0.3 mg/dL (calc) (ref 0.2–1.2)
Total Bilirubin: 0.4 mg/dL (ref 0.2–1.2)
Total Protein: 6.7 g/dL (ref 6.1–8.1)

## 2021-03-09 NOTE — Progress Notes (Signed)
Subjective:   Patient ID: Mikayla Richardson, female   DOB: 50 y.o.   MRN: [id number]   HPI 50 year old female presents the office today for concerns of her toenails becoming thickened discolored and fungus on the nails.  Denies any pain the nails at this time occasionally causes discomfort.  No swelling redness or drainage.  Also she has a bunion on her right foot but is not causing any significant pain.   Review of Systems  All other systems reviewed and are negative.  Past Medical History:  Diagnosis Date   Allergy    Anxiety    Asthma    Blood transfusion without reported diagnosis    1991 after childbirth   Colon polyps    GERD (gastroesophageal reflux disease)    Thyroid disease    UTI (urinary tract infection)     Past Surgical History:  Procedure Laterality Date   ABDOMINAL SURGERY     APPENDECTOMY     BACK SURGERY     CHOLECYSTECTOMY     COLONOSCOPY     GANGLION CYST EXCISION     right wrist 1992   HERNIA REPAIR       Current Outpatient Medications:    levothyroxine (SYNTHROID) 112 MCG tablet, Take by mouth., Disp: , Rfl:    omeprazole (PRILOSEC) 40 MG capsule, Take 1 capsule by mouth daily., Disp: , Rfl:    rosuvastatin (CRESTOR) 20 MG tablet, Take 1 tablet by mouth daily., Disp: , Rfl:    ACETAMINOPHEN EXTRA STRENGTH 500 MG capsule, SMARTSIG:2 Capsule(s) By Mouth Every 8 Hours PRN, Disp: , Rfl:    albuterol (PROVENTIL) (2.5 MG/3ML) 0.083% nebulizer solution, Take 3 mLs (2.5 mg total) by nebulization every 6 (six) hours as needed for wheezing or shortness of breath., Disp: 75 mL, Rfl: 3   budesonide-formoterol (SYMBICORT) 80-4.5 MCG/ACT inhaler, Inhale 2 puffs into the lungs 2 (two) times daily., Disp: 10.2 g, Rfl: 6   fluticasone (FLONASE) 50 MCG/ACT nasal spray, Place into both nostrils., Disp: , Rfl:    fluticasone-salmeterol (ADVAIR) 250-50 MCG/ACT AEPB, Inhale into the lungs., Disp: , Rfl:    loratadine (CLARITIN) 10 MG tablet, , Disp: , Rfl:    meloxicam  (MOBIC) 15 MG tablet, Take 15 mg by mouth daily., Disp: , Rfl:    montelukast (SINGULAIR) 10 MG tablet, Take 10 mg by mouth at bedtime., Disp: , Rfl:    omeprazole (PRILOSEC) 40 MG capsule, TAKE 1 CAPSULE BY MOUTH TWICE A DAY, Disp: 180 capsule, Rfl: 1  No Known Allergies       Objective:  Physical Exam  General: AAO x3, NAD  Dermatological: Nails appear to be hypertrophic, dystrophic with brown discoloration.  There is no edema, erythema or signs of infection.  No open lesions.  Vascular: Dorsalis Pedis artery and Posterior Tibial artery pedal pulses are 2/4 bilateral with immedate capillary fill time. There is no pain with calf compression, swelling, warmth, erythema.   Neruologic: Grossly intact via light touch bilateral.   Musculoskeletal: Mild bunion right foot without any pain.  No pain on the bunion site there is no pain or crepitation with major range of motion.  No first ray hypermobility present.  Muscular strength 5/5 in all groups tested bilateral.  Gait: Unassisted, Nonantalgic.       Assessment:   Onychomycosis; bunion right foot     Plan:  -Treatment options discussed including all alternatives, risks, and complications -Etiology of symptoms were discussed -We discussed oral, topical as well as alternative  treatments for nail fungus.  After discussion elects to proceed with oral medication.  We discussed success rates, side effects.  We will check a CBC and LFT prior to starting the medication. -Discussed treatment options for the bunion.  Discussed shoe modifications, offloading padding.  Trula Slade DPM

## 2021-03-10 ENCOUNTER — Other Ambulatory Visit: Payer: Self-pay | Admitting: Podiatry

## 2021-03-10 DIAGNOSIS — Z79899 Other long term (current) drug therapy: Secondary | ICD-10-CM

## 2021-03-10 MED ORDER — TERBINAFINE HCL 250 MG PO TABS: 250.0000 mg | ORAL_TABLET | Freq: Every day | ORAL | 0 refills | Status: DC

## 2021-03-30 ENCOUNTER — Encounter: Payer: Self-pay | Admitting: Gastroenterology

## 2021-04-21 ENCOUNTER — Ambulatory Visit: Payer: BC Managed Care – PPO | Admitting: Adult Health

## 2021-04-26 ENCOUNTER — Telehealth: Payer: Self-pay | Admitting: Podiatry

## 2021-04-26 NOTE — Telephone Encounter (Signed)
Patient called and wanted to go ahead and get lab work done before her next appointment due to her taking the Lamisil.   Please Advise

## 2021-05-02 ENCOUNTER — Other Ambulatory Visit: Payer: Self-pay

## 2021-05-02 ENCOUNTER — Ambulatory Visit: Payer: 59 | Admitting: Adult Health

## 2021-05-02 ENCOUNTER — Encounter: Payer: Self-pay | Admitting: Adult Health

## 2021-05-02 VITALS — BP 110/70 | HR 94 | Temp 98.1°F | Ht 64.0 in | Wt 169.6 lb

## 2021-05-02 DIAGNOSIS — F17200 Nicotine dependence, unspecified, uncomplicated: Secondary | ICD-10-CM | POA: Diagnosis not present

## 2021-05-02 DIAGNOSIS — J302 Other seasonal allergic rhinitis: Secondary | ICD-10-CM | POA: Diagnosis not present

## 2021-05-02 DIAGNOSIS — J4531 Mild persistent asthma with (acute) exacerbation: Secondary | ICD-10-CM | POA: Diagnosis not present

## 2021-05-02 MED ORDER — PREDNISONE 10 MG PO TABS: ORAL_TABLET | ORAL | 0 refills | Status: DC

## 2021-05-02 NOTE — Progress Notes (Signed)
@Patient  ID: Mikayla Richardson, female    DOB: Jul 05, 1971, 50 y.o.   MRN: [id number]  Chief Complaint  Patient presents with   Follow-up    Referring provider: No ref. provider found  HPI: 50 year old female former smoker followed for asthma, chronic cough, lung nodule  TEST/EVENTS :  Pulmonary function testing done June 2019 showed normal lung function with an FEV1 at 103%, ratio 82, FVC 99%, positive bronchodilator response.,  DLCO 80%.   High-resolution CT chest January 11, 2018 showed no evidence of interstitial lung disease.  Solid 3 mm apical right upper lobe nodule.   CT chest March 05, 2020 showed biapical pleural-parenchymal scarring, mild emphysema, noncalcified pulmonary nodules measuring 4 mm or less.  Unchanged and consistent with benign etiology.    05/02/2021 Follow up : Cough  Patient presents for work in visit.  Patient is followed for underlying asthma and a chronic cough.  She complains of increased coughing wheezing over the last 2 weeks.  Patient says she had a COVID-19 infection 2 weeks ago.  She had cold-like symptoms.  But is continued to have ongoing cough and wheezing.  She has had no fever, discolored mucus, chest pain, orthopnea. She was able to quit smoking last year.  She has an extensive smoking history smoking for the last 35 years.  Up to a pack a day. Patient remains on Symbicort twice daily.  She is on Claritin and Singulair.  No Known Allergies  Immunization History  Administered Date(s) Administered   Influenza Split 06/19/2017   Influenza,inj,Quad PF,6+ Mos 05/14/2018   Pneumococcal Polysaccharide-23 01/08/2013    Past Medical History:  Diagnosis Date   Allergy    Anxiety    Asthma    Blood transfusion without reported diagnosis    1991 after childbirth   Colon polyps    GERD (gastroesophageal reflux disease)    Thyroid disease    UTI (urinary tract infection)     Tobacco History: Social History   Tobacco Use  Smoking Status Former    Packs/day: 1.00   Years: 15.00   Pack years: 15.00   Types: Cigarettes   Quit date: 06/16/2020   Years since quitting: 0.8  Smokeless Tobacco Never  Tobacco Comments   Quit smoking as of 06/2020   05/02/2021 TH   Counseling given: Not Answered Tobacco comments: Quit smoking as of 06/2020 05/02/2021 TH   Outpatient Medications Prior to Visit  Medication Sig Dispense Refill   albuterol (PROVENTIL) (2.5 MG/3ML) 0.083% nebulizer solution Take 3 mLs (2.5 mg total) by nebulization every 6 (six) hours as needed for wheezing or shortness of breath. 75 mL 3   budesonide-formoterol (SYMBICORT) 80-4.5 MCG/ACT inhaler Inhale 2 puffs into the lungs 2 (two) times daily. 10.2 g 6   fluticasone (FLONASE) 50 MCG/ACT nasal spray Place into both nostrils.     levothyroxine (SYNTHROID) 112 MCG tablet Take by mouth.     loratadine (CLARITIN) 10 MG tablet      meloxicam (MOBIC) 15 MG tablet Take 15 mg by mouth daily.     montelukast (SINGULAIR) 10 MG tablet Take 10 mg by mouth at bedtime.     omeprazole (PRILOSEC) 40 MG capsule TAKE 1 CAPSULE BY MOUTH TWICE A DAY 180 capsule 1   rosuvastatin (CRESTOR) 20 MG tablet Take 1 tablet by mouth daily.     terbinafine (LAMISIL) 250 MG tablet Take 1 tablet (250 mg total) by mouth daily. 90 tablet 0   ACETAMINOPHEN EXTRA STRENGTH 500 MG capsule  SMARTSIG:2 Capsule(s) By Mouth Every 8 Hours PRN (Patient not taking: Reported on 05/02/2021)     fluticasone-salmeterol (ADVAIR) 250-50 MCG/ACT AEPB Inhale into the lungs. (Patient not taking: Reported on 05/02/2021)     omeprazole (PRILOSEC) 40 MG capsule Take 1 capsule by mouth daily. (Patient not taking: Reported on 05/02/2021)     No facility-administered medications prior to visit.     Review of Systems:   Constitutional:   No  weight loss, night sweats,  Fevers, chills, fatigue, or  lassitude.  HEENT:   No headaches,  Difficulty swallowing,  Tooth/dental problems, or  Sore throat,                No sneezing,  itching, ear ache,  +nasal congestion, post nasal drip,   CV:  No chest pain,  Orthopnea, PND, swelling in lower extremities, anasarca, dizziness, palpitations, syncope.   GI  No heartburn, indigestion, abdominal pain, nausea, vomiting, diarrhea, change in bowel habits, loss of appetite, bloody stools.   Resp: .  No chest wall deformity  Skin: no rash or lesions.  GU: no dysuria, change in color of urine, no urgency or frequency.  No flank pain, no hematuria   MS:  No joint pain or swelling.  No decreased range of motion.  No back pain.    Physical Exam  BP 110/70 (BP Location: Left Arm, Patient Position: Sitting, Cuff Size: Large)   Pulse 94   Temp 98.1 F (36.7 C) (Oral)   Ht 5\' 4"  (1.626 m)   Wt 169 lb 9.6 oz (76.9 kg)   LMP 06/04/2012   SpO2 96%   BMI 29.11 kg/m   GEN: A/Ox3; pleasant , NAD, well nourished    HEENT:  [place]/AT,   NOSE-clear, THROAT-clear, no lesions, no postnasal drip or exudate noted.   NECK:  Supple w/ fair ROM; no JVD; normal carotid impulses w/o bruits; no thyromegaly or nodules palpated; no lymphadenopathy.    RESP  Clear  P & A; w/o, wheezes/ rales/ or rhonchi. no accessory muscle use, no dullness to percussion  CARD:  RRR, no m/r/g, no peripheral edema, pulses intact, no cyanosis or clubbing.  GI:   Soft & nt; nml bowel sounds; no organomegaly or masses detected.   Musco: Warm bil, no deformities or joint swelling noted.   Neuro: alert, no focal deficits noted.    Skin: Warm, no lesions or rashes    Lab Results:  CBC    Component Value Date/Time   WBC 5.4 03/07/2021 1122   RBC 4.53 03/07/2021 1122   HGB 13.8 03/07/2021 1122   HCT 41.3 03/07/2021 1122   PLT 254 03/07/2021 1122   MCV 91.2 03/07/2021 1122   MCH 30.5 03/07/2021 1122   MCHC 33.4 03/07/2021 1122   RDW 13.5 03/07/2021 1122   LYMPHSABS 1,636 03/07/2021 1122   MONOABS 0.6 08/04/2012 2013   EOSABS 427 03/07/2021 1122   BASOSABS 59 03/07/2021 1122    BMET  No  results found for: BNP  ProBNP No results found for: PROBNP  Imaging: No results found.    PFT Results Latest Ref Rng & Units 01/23/2018  FVC-Pre L 3.37  FVC-Predicted Pre % 93  FVC-Post L 3.60  FVC-Predicted Post % 99  Pre FEV1/FVC % % 78  Post FEV1/FCV % % 82  FEV1-Pre L 2.64  FEV1-Predicted Pre % 91  FEV1-Post L 2.96  DLCO uncorrected ml/min/mmHg 20.01  DLCO [facility]C% % 80  DLVA Predicted % 76  TLC L 5.93  TLC % Predicted % 115  RV % Predicted % 149    Lab Results  Component Value Date   NITRICOXIDE 18 01/02/2018        Assessment & Plan:   No problem-specific Assessment & Plan notes found for this encounter.     Rexene Edison, NP 05/02/2021

## 2021-05-02 NOTE — Patient Instructions (Addendum)
Prednisone taper over next week.  Robitussin DM 1-2 tsp every 4hr as needed for cough  Continue on Claritin and Singulair daily.  Continue on Symbicort 2 puffs Twice daily , rinse after use.  Albuterol inhaler As needed   Refer to LDCT chest screening program.  Follow up with Dr. Chase Caller or Oneika Simonian in 1 year and As needed   Please contact office for sooner follow up if symptoms do not improve or worsen or seek emergency care

## 2021-05-03 ENCOUNTER — Encounter: Payer: Self-pay | Admitting: Adult Health

## 2021-05-03 NOTE — Assessment & Plan Note (Signed)
Acute asthmatic bronchitic exacerbation.  Most likely due to recent viral infection with COVID-19.  Patient will be treated with empiric steroids.  Continue on maintenance regimen. She does have an extensive smoking history.  Will refer to the low-dose CT screening program.  Plan  Patient Instructions  Prednisone taper over next week.  Robitussin DM 1-2 tsp every 4hr as needed for cough  Continue on Claritin and Singulair daily.  Continue on Symbicort 2 puffs Twice daily , rinse after use.  Albuterol inhaler As needed   Refer to LDCT chest screening program.  Follow up with Dr. Chase Caller or Joletta Manner in 1 year and As needed   Please contact office for sooner follow up if symptoms do not improve or worsen or seek emergency care

## 2021-05-03 NOTE — Assessment & Plan Note (Signed)
Congratulated on smoking cessation.  Patient will be referred to the low-dose CT screening program

## 2021-05-03 NOTE — Assessment & Plan Note (Signed)
Continue on current regimen .   

## 2021-05-20 ENCOUNTER — Other Ambulatory Visit: Payer: Self-pay | Admitting: Podiatry

## 2021-05-20 DIAGNOSIS — Z79899 Other long term (current) drug therapy: Secondary | ICD-10-CM

## 2021-06-06 ENCOUNTER — Other Ambulatory Visit: Payer: Self-pay

## 2021-06-06 ENCOUNTER — Encounter: Payer: Self-pay | Admitting: Podiatry

## 2021-06-06 ENCOUNTER — Ambulatory Visit: Payer: 59 | Admitting: Podiatry

## 2021-06-06 DIAGNOSIS — Z79899 Other long term (current) drug therapy: Secondary | ICD-10-CM

## 2021-06-06 DIAGNOSIS — B351 Tinea unguium: Secondary | ICD-10-CM

## 2021-06-06 NOTE — Patient Instructions (Signed)
Terbinafine tablets What is this medication? TERBINAFINE (TER bin a feen) is an antifungal medicine. It is used to treatcertain kinds of fungal or yeast infections. This medicine may be used for other purposes; ask your health care provider orpharmacist if you have questions. COMMON BRAND NAME(S): Lamisil, Terbinex What should I tell my care team before I take this medication? They need to know if you have any of these conditions: drink alcoholic beverages kidney disease liver disease an unusual or allergic reaction to terbinafine, other medicines, foods, dyes, or preservatives pregnant or trying to get pregnant breast-feeding How should I use this medication? Take this medicine by mouth with a full glass of water. Follow the directions on the prescription label. You can take this medicine with food or on an empty stomach. Take your medicine at regular intervals. Do not take your medicine more often than directed. Do not skip doses or stop your medicine early even ifyou feel better. Do not stop taking except on your doctor's advice. A special MedGuide will be given to you by the pharmacist with eachprescription and refill. Be sure to read this information carefully each time. Talk to your pediatrician regarding the use of this medicine in children.Special care may be needed. Overdosage: If you think you have taken too much of this medicine contact apoison control center or emergency room at once. NOTE: This medicine is only for you. Do not share this medicine with others. What if I miss a dose? If you miss a dose, take it as soon as you can. If it is almost time for yournext dose, take only that dose. Do not take double or extra doses. What may interact with this medication? Do not take this medicine with any of the following medications: thioridazine This medicine may also interact with the following medications: beta-blockers caffeine cimetidine cyclosporine medicines for depression,  anxiety, or psychotic disturbances medicines for fungal infections like fluconazole and ketoconazole medicines for irregular heartbeat like amiodarone, flecainide and propafenone rifampin warfarin This list may not describe all possible interactions. Give your health care provider a list of all the medicines, herbs, non-prescription drugs, or dietary supplements you use. Also tell them if you smoke, drink alcohol, or use illegaldrugs. Some items may interact with your medicine. What should I watch for while using this medication? Visit your doctor or health care provider regularly. Tell your doctor right away if you have nausea or vomiting, loss of appetite, stomach pain on your right upper side, yellow skin, dark urine, light stools, or are over tired. Some fungal infections need many weeks or months of treatment to cure. If you are taking this medicine for a long time, you will need to have important bloodwork done. This medicine may cause serious skin reactions. They can happen weeks to months after starting the medicine. Contact your health care provider right away if you notice fevers or flu-like symptoms with a rash. The rash may be red or purple and then turn into blisters or peeling of the skin. Or, you might notice a red rash with swelling of the face, lips or lymph nodes in your neck or underyour arms. What side effects may I notice from receiving this medication? Side effects that you should report to your doctor or health care professionalas soon as possible: allergic reactions like skin rash or hives, swelling of the face, lips, or tongue changes in vision dark urine fever or infection general ill feeling or flu-like symptoms light-colored stools loss of appetite, nausea rash, fever,   and swollen lymph nodes redness, blistering, peeling or loosening of the skin, including inside the mouth right upper belly pain unusually weak or tired yellowing of the eyes or skin Side effects that  usually do not require medical attention (report to yourdoctor or health care professional if they continue or are bothersome): changes in taste diarrhea hair loss muscle or joint pain stomach gas stomach upset This list may not describe all possible side effects. Call your doctor for medical advice about side effects. You may report side effects to FDA at1-800-FDA-1088. Where should I keep my medication? Keep out of the reach of children. Store at room temperature below 25 degrees C (77 degrees F). Protect fromlight. Throw away any unused medicine after the expiration date. NOTE: This sheet is a summary. It may not cover all possible information. If you have questions about this medicine, talk to your doctor, pharmacist, orhealth care provider.  2022 Elsevier/Gold Standard (2018-11-01 15:37:07)  

## 2021-06-08 ENCOUNTER — Other Ambulatory Visit: Payer: Self-pay | Admitting: Podiatry

## 2021-06-08 DIAGNOSIS — B351 Tinea unguium: Secondary | ICD-10-CM | POA: Insufficient documentation

## 2021-06-08 LAB — HEPATIC FUNCTION PANEL
AG Ratio: 1.8 (calc) (ref 1.0–2.5)
ALT: 9 U/L (ref 6–29)
AST: 19 U/L (ref 10–35)
Albumin: 4.2 g/dL (ref 3.6–5.1)
Alkaline phosphatase (APISO): 94 U/L (ref 37–153)
Bilirubin, Direct: 0.1 mg/dL (ref 0.0–0.2)
Globulin: 2.4 g/dL (calc) (ref 1.9–3.7)
Indirect Bilirubin: 0.2 mg/dL (calc) (ref 0.2–1.2)
Total Bilirubin: 0.3 mg/dL (ref 0.2–1.2)
Total Protein: 6.6 g/dL (ref 6.1–8.1)

## 2021-06-08 LAB — CBC WITH DIFFERENTIAL/PLATELET
Absolute Monocytes: 731 cells/uL (ref 200–950)
Basophils Absolute: 69 cells/uL (ref 0–200)
Basophils Relative: 1.1 %
Eosinophils Absolute: 284 cells/uL (ref 15–500)
Eosinophils Relative: 4.5 %
HCT: 40.8 % (ref 35.0–45.0)
Hemoglobin: 13.7 g/dL (ref 11.7–15.5)
Lymphs Abs: 1909 cells/uL (ref 850–3900)
MCH: 30.8 pg (ref 27.0–33.0)
MCHC: 33.6 g/dL (ref 32.0–36.0)
MCV: 91.7 fL (ref 80.0–100.0)
MPV: 10.5 fL (ref 7.5–12.5)
Monocytes Relative: 11.6 %
Neutro Abs: 3308 cells/uL (ref 1500–7800)
Neutrophils Relative %: 52.5 %
Platelets: 233 10*3/uL (ref 140–400)
RBC: 4.45 10*6/uL (ref 3.80–5.10)
RDW: 14 % (ref 11.0–15.0)
Total Lymphocyte: 30.3 %
WBC: 6.3 10*3/uL (ref 3.8–10.8)

## 2021-06-08 MED ORDER — TERBINAFINE HCL 250 MG PO TABS: 250.0000 mg | ORAL_TABLET | Freq: Every day | ORAL | 0 refills | Status: DC

## 2021-06-08 NOTE — Progress Notes (Signed)
Subjective: 50 year old female presents the office today for follow-up evaluation of nail fungus.  She has been on Lamisil and she has been tolerating medication well without any side effects.  She said the nails are doing better.  No swelling redness or drainage of the toenail sites.  She has no other concerns.  Objective: AAO x3, NAD DP/PT pulses palpable bilaterally, CRT less than 3 seconds Over the nails are doing better and they are starting to grow out there is clearing on the proximal aspect.  On the distal portion the nails are still hypertrophy the nail with yellow-brown discoloration.  There is no pain in the nails there is no redness or drainage or any signs of infection noted.  There is no open lesions. No pain with calf compression, swelling, warmth, erythema  Assessment: Onychomycosis, currently on Lamisil without side effects  Plan: -All treatment options discussed with the patient including all alternatives, risks, complications.  -At this point were to recheck a CBC and LFT.  Discussed treatment options for the nail fungus as it is growing out.  Although it is grown out still present 1-30 more days of Lamisil.  We will wait for the results of the blood work before ordering this. -Patient encouraged to call the office with any questions, concerns, change in symptoms.   Trula Slade DPM

## 2021-06-10 ENCOUNTER — Telehealth: Payer: Self-pay | Admitting: *Deleted

## 2021-06-10 NOTE — Telephone Encounter (Signed)
Called and left a message for the patient and relayed the message per Dr Wagoner. Miata Culbreth 

## 2021-06-10 NOTE — Telephone Encounter (Signed)
-----   Message from Trula Slade, DPM sent at 06/08/2021  3:43 PM EDT ----- Normal- sent 30 more days of Lamisil to the pharmacy. Please let her know. Thanks.

## 2021-06-13 ENCOUNTER — Other Ambulatory Visit: Payer: Self-pay | Admitting: Podiatry

## 2021-06-13 NOTE — Progress Notes (Signed)
error 

## 2021-06-14 ENCOUNTER — Encounter: Payer: 59 | Admitting: Gastroenterology

## 2021-06-16 ENCOUNTER — Other Ambulatory Visit: Payer: Self-pay | Admitting: *Deleted

## 2021-06-16 DIAGNOSIS — Z87891 Personal history of nicotine dependence: Secondary | ICD-10-CM

## 2021-06-29 ENCOUNTER — Encounter: Payer: Self-pay | Admitting: Gastroenterology

## 2021-06-29 ENCOUNTER — Other Ambulatory Visit: Payer: Self-pay

## 2021-06-29 ENCOUNTER — Ambulatory Visit (AMBULATORY_SURGERY_CENTER): Payer: 59 | Admitting: *Deleted

## 2021-06-29 VITALS — Ht 64.0 in | Wt 170.6 lb

## 2021-06-29 DIAGNOSIS — Z86010 Personal history of colonic polyps: Secondary | ICD-10-CM

## 2021-06-29 DIAGNOSIS — Z800 Family history of malignant neoplasm of digestive organs: Secondary | ICD-10-CM

## 2021-06-29 MED ORDER — PLENVU 140 G PO SOLR: 1.0000 | Freq: Once | ORAL | 0 refills | Status: AC

## 2021-06-29 NOTE — Progress Notes (Signed)
Pt's previsit is done over the phone and all paperwork (prep instructions, blank consent form to just read over) sent to patient via MyChart and mail. Pt's name and DOB verified at the beginning of the previsit.  Pt denies any difficulty with ambulating.    No trouble with anesthesia, denies being told they were difficult to intubate, or hx/fam hx of malignant hyperthermia per pt   No egg or soy allergy  No home oxygen use   No medications for weight loss taken  Pt denies constipation issues  Pt informed that we do not do prior authorizations for prep  Plenvu code put into RX and coupon sent to pt

## 2021-07-12 ENCOUNTER — Telehealth: Payer: Self-pay | Admitting: Gastroenterology

## 2021-07-12 NOTE — Telephone Encounter (Signed)
Patient called this morning to cancel her procedure for tomorrow morning.  She has the flu with a fever, chills, body aches.  She has rescheduled to 09/07/21 at 10:30 a.m.

## 2021-07-13 ENCOUNTER — Encounter: Payer: 59 | Admitting: Gastroenterology

## 2021-07-18 ENCOUNTER — Telehealth: Payer: 59 | Admitting: Acute Care

## 2021-07-18 ENCOUNTER — Other Ambulatory Visit: Payer: Self-pay

## 2021-07-19 ENCOUNTER — Inpatient Hospital Stay: Admission: RE | Admit: 2021-07-19 | Payer: 59 | Source: Ambulatory Visit

## 2021-08-03 ENCOUNTER — Telehealth: Payer: Self-pay | Admitting: Acute Care

## 2021-08-03 NOTE — Telephone Encounter (Signed)
Attempted to contact to reschedule SDMV and LDCT.  Unable to reach 08/02/21 or 08/03/21 but left voicemail. Patient had called in and left [place] message also to reschedule.

## 2021-09-05 ENCOUNTER — Encounter: Payer: Self-pay | Admitting: Primary Care

## 2021-09-05 ENCOUNTER — Telehealth: Payer: 59 | Admitting: Primary Care

## 2021-09-05 ENCOUNTER — Other Ambulatory Visit: Payer: Self-pay

## 2021-09-05 DIAGNOSIS — Z87891 Personal history of nicotine dependence: Secondary | ICD-10-CM

## 2021-09-05 NOTE — Patient Instructions (Signed)
Thank you for participating in the [facility]. °It was our pleasure to meet you today. °We will call you with the results of your scan within the next few days. °Your scan will be assigned a Lung RADS category score by the physicians reading the scans.  °This Lung RADS score determines follow up scanning.  °See below for description of categories, and follow up screening recommendations. °We will be in touch to schedule your follow up screening annually or based on recommendations of our providers. °We will fax a copy of your scan results to your Primary Care Physician, or the physician who referred you to the program, to ensure they have the results. °Please call the office if you have any questions or concerns regarding your scanning experience or results.  °Our office number is [phone]. °Please speak with Denise Phelps, RN. She is our Lung Cancer Screening RN. °If she is unavailable when you call, please have the office staff send her a message. She will return your call at her earliest convenience. °Remember, if your scan is normal, we will scan you annually as long as you continue to meet the criteria for the program. (Age 55-77, Current smoker or smoker who has quit within the last 15 years). °If you are a smoker, remember, quitting is the single most powerful action that you can take to decrease your risk of lung cancer and other pulmonary, breathing related problems. °We know quitting is hard, and we are here to help.  °Please let us know if there is anything we can do to help you meet your goal of quitting. °If you are a former smoker, congratulations. We are proud of you! Remain smoke free! °Remember you can refer friends or family members through the number above.  °We will screen them to make sure they meet criteria for the program. °Thank you for helping us take better care of you by participating in Lung Screening. ° °You can receive free nicotine replacement therapy  ( patches, gum or mints) by calling 1-800-QUIT NOW. Please call so we can get you on the path to becoming  a non-smoker. I know it is hard, but you can do this! ° °Lung RADS Categories: ° °Lung RADS 1: no nodules or definitely non-concerning nodules.  °Recommendation is for a repeat annual scan in 12 months. ° °Lung RADS 2:  nodules that are non-concerning in appearance and behavior with a very low likelihood of becoming an active cancer. °Recommendation is for a repeat annual scan in 12 months. ° °Lung RADS 3: nodules that are probably non-concerning , includes nodules with a low likelihood of becoming an active cancer.  Recommendation is for a 6-month repeat screening scan. Often noted after an upper respiratory illness. We will be in touch to make sure you have no questions, and to schedule your 6-month scan. ° °Lung RADS 4 A: nodules with concerning findings, recommendation is most often for a follow up scan in 3 months or additional testing based on our provider's assessment of the scan. We will be in touch to make sure you have no questions and to schedule the recommended 3 month follow up scan. ° °Lung RADS 4 B:  indicates findings that are concerning. We will be in touch with you to schedule additional diagnostic testing based on our provider's  assessment of the scan. ° °Hypnosis for smoking cessation  °[employer] °[phone] ° °Acupuncture for smoking cessation  °[facility] °[phone]  °

## 2021-09-05 NOTE — Progress Notes (Signed)
Virtual Visit via Video Note  I connected with Mikayla Richardson on 09/05/21 at  2:00 PM EST by a video enabled telemedicine application and verified that I am speaking with the correct person using two identifiers.  Location: Patient: Home Provider: Office   I discussed the limitations of evaluation and management by telemedicine and the availability of in person appointments. The patient expressed understanding and agreed to proceed.   Shared Decision Making Visit Lung Cancer Screening Program [phone])   Eligibility: Age 51 y.o. Pack Years Smoking History Calculation 52 (# packs/per year x # years smoked) Recent History of coughing up blood  no Unexplained weight loss? no ( >Than 15 pounds within the last 6 months ) Prior History Lung / other cancer no (Diagnosis within the last 5 years already requiring surveillance chest CT Scans). Smoking Status Former Smoker Former Smokers: Years since quit: < 1 year  Quit Date: NA  Visit Components: Discussion included one or more decision making aids. yes Discussion included risk/benefits of screening. yes Discussion included potential follow up diagnostic testing for abnormal scans. yes Discussion included meaning and risk of over diagnosis. yes Discussion included meaning and risk of False Positives. yes Discussion included meaning of total radiation exposure. yes  Counseling Included: Importance of adherence to annual lung cancer LDCT screening. yes Impact of comorbidities on ability to participate in the program. yes Ability and willingness to under diagnostic treatment. yes  Smoking Cessation Counseling: Current Smokers:  Discussed importance of smoking cessation. yes Information about tobacco cessation classes and interventions provided to patient. yes Patient provided with "ticket" for LDCT Scan. NA Symptomatic Patient. no  Counseling(Intermediate counseling: > three minutes) 99406 Diagnosis Code: Tobacco Use  Z72.0 Asymptomatic Patient yes  Counseling (Intermediate counseling: > three minutes counseling) O1224 Former Smokers:  Discussed the importance of maintaining cigarette abstinence. yes Diagnosis Code: Personal History of Nicotine Dependence. M25.003 Information about tobacco cessation classes and interventions provided to patient. Yes Patient provided with "ticket" for LDCT Scan. NA Written Order for Lung Cancer Screening with LDCT placed in Epic. Yes (CT Chest Lung Cancer Screening Low Dose W/O CM) [id number] Z12.2-Screening of respiratory organs Z87.891-Personal history of nicotine dependence  I have spent 25 minutes of face to face/ virtual visit   time with MS Plourde discussing the risks and benefits of lung cancer screening. We viewed / discussed a power point together that explained in detail the above noted topics. We paused at intervals to allow for questions to be asked and answered to ensure understanding.We discussed that the single most powerful action that she can take to decrease her risk of developing lung cancer is to quit smoking. We discussed whether or not she is ready to commit to setting a quit date. We discussed options for tools to aid in quitting smoking including nicotine replacement therapy, non-nicotine medications, support groups, Quit Smart classes, and behavior modification. We discussed that often times setting smaller, more achievable goals, such as eliminating 1 cigarette a day for a week and then 2 cigarettes a day for a week can be helpful in slowly decreasing the number of cigarettes smoked. This allows for a sense of accomplishment as well as providing a clinical benefit. I provided her with smoking cessation  information  with contact information for community resources, classes, free nicotine replacement therapy, and access to mobile apps, text messaging, and on-line smoking cessation help. I have also provided her  the office contact information in the event she  needs to contact me,  or the screening staff. We discussed the time and location of the scan, and that either Doroteo Glassman RN, Joella Prince, RN  or I will call / send a letter with the results within 24-72 hours of receiving them. The patient verbalized understanding of all of  the above and had no further questions upon leaving the office. They have my contact information in the event they have any further questions.  I spent 3-5 minutes counseling on smoking cessation and the health risks of continued tobacco abuse.  I explained to the patient that there has been a high incidence of coronary artery disease noted on these exams. I explained that this is a non-gated exam therefore degree or severity cannot be determined. This patient is on statin therapy. I have asked the patient to follow-up with their PCP regarding any incidental finding of coronary artery disease and management with diet or medication as their PCP  feels is clinically indicated. The patient verbalized understanding of the above and had no further questions upon completion of the visit.     Martyn Ehrich, NP

## 2021-09-06 ENCOUNTER — Ambulatory Visit (INDEPENDENT_AMBULATORY_CARE_PROVIDER_SITE_OTHER)
Admission: RE | Admit: 2021-09-06 | Discharge: 2021-09-06 | Disposition: A | Discharge status: Home / Self Care | Payer: 59 | Source: Ambulatory Visit | Attending: Acute Care | Admitting: Acute Care

## 2021-09-06 ENCOUNTER — Other Ambulatory Visit: Payer: Self-pay

## 2021-09-06 ENCOUNTER — Ambulatory Visit: Payer: 59

## 2021-09-06 DIAGNOSIS — Z87891 Personal history of nicotine dependence: Secondary | ICD-10-CM

## 2021-09-07 ENCOUNTER — Encounter: Payer: Self-pay | Admitting: Gastroenterology

## 2021-09-07 ENCOUNTER — Ambulatory Visit (AMBULATORY_SURGERY_CENTER): Payer: 59 | Admitting: Gastroenterology

## 2021-09-07 VITALS — BP 116/77 | HR 68 | Temp 97.8°F | Resp 14 | Ht 64.0 in | Wt 170.6 lb

## 2021-09-07 DIAGNOSIS — Z86010 Personal history of colonic polyps: Secondary | ICD-10-CM

## 2021-09-07 DIAGNOSIS — D125 Benign neoplasm of sigmoid colon: Secondary | ICD-10-CM

## 2021-09-07 DIAGNOSIS — K635 Polyp of colon: Secondary | ICD-10-CM | POA: Diagnosis not present

## 2021-09-07 DIAGNOSIS — D123 Benign neoplasm of transverse colon: Secondary | ICD-10-CM

## 2021-09-07 MED ORDER — SODIUM CHLORIDE 0.9 % IV SOLN: 500.0000 mL | Freq: Once | INTRAVENOUS | Status: DC

## 2021-09-07 NOTE — Progress Notes (Signed)
Called to room to assist during endoscopic procedure.  Patient ID and intended procedure confirmed with present staff. Received instructions for my participation in the procedure from the performing physician.  

## 2021-09-07 NOTE — Progress Notes (Signed)
Pt's states no medical or surgical changes since previsit or office visit.   VS taken by DT 

## 2021-09-07 NOTE — Op Note (Signed)
[facility] Patient Name: Mikayla Richardson Procedure Date: 09/07/2021 10:08 AM MRN: [id number] Endoscopist: Ladene Artist , MD Age: 51 Referring MD:  Date of Birth: [birth year] Gender: Female Account #: [id number] Procedure:                Colonoscopy Indications:              High risk colon cancer surveillance: Personal                            history of sessile serrated colon polyp (less than                            10 mm in size) with no dysplasia Medicines:                Monitored Anesthesia Care Procedure:                Pre-Anesthesia Assessment:                           - Prior to the procedure, a History and Physical                            was performed, and patient medications and                            allergies were reviewed. The patient's tolerance of                            previous anesthesia was also reviewed. The risks                            and benefits of the procedure and the sedation                            options and risks were discussed with the patient.                            All questions were answered, and informed consent                            was obtained. Prior Anticoagulants: The patient has                            taken no previous anticoagulant or antiplatelet                            agents. ASA Grade Assessment: II - A patient with                            mild systemic disease. After reviewing the risks                            and benefits, the patient was deemed in  satisfactory condition to undergo the procedure.                           After obtaining informed consent, the colonoscope                            was passed under direct vision. Throughout the                            procedure, the patient's blood pressure, pulse, and                            oxygen saturations were monitored continuously. The                            Colonoscope was introduced  through the anus and                            advanced to the the cecum, identified by                            appendiceal orifice and ileocecal valve. The                            ileocecal valve, appendiceal orifice, and rectum                            were photographed. The quality of the bowel                            preparation was good. The colonoscopy was performed                            without difficulty. The patient tolerated the                            procedure well. Scope In: 10:12:11 AM Scope Out: 10:32:37 AM Scope Withdrawal Time: 0 hours 16 minutes 20 seconds  Total Procedure Duration: 0 hours 20 minutes 26 seconds  Findings:                 The perianal and digital rectal examinations were                            normal.                           Five sessile polyps were found in the sigmoid colon                            (1) and transverse colon (4). The polyps were 6 to                            7 mm in size. These polyps were removed with a cold  snare. Resection and retrieval were complete.                           Internal hemorrhoids were found during                            retroflexion. The hemorrhoids were small and Grade                            I (internal hemorrhoids that do not prolapse).                           The exam was otherwise without abnormality on                            direct and retroflexion views. Complications:            No immediate complications. Estimated blood loss:                            None. Estimated Blood Loss:     Estimated blood loss: none. Impression:               - Five 6 to 7 mm polyps in the sigmoid colon and in                            the transverse colon, removed with a cold snare.                            Resected and retrieved.                           - Internal hemorrhoids.                           - The examination was otherwise normal on direct                             and retroflexion views. Recommendation:           - Repeat colonoscopy, likely 3 years, after studies                            are complete for surveillance based on pathology                            results.                           - Patient has a contact number available for                            emergencies. The signs and symptoms of potential                            delayed complications were discussed with the  patient. Return to normal activities tomorrow.                            Written discharge instructions were provided to the                            patient.                           - Resume previous diet.                           - Continue present medications.                           - Await pathology results. Ladene Artist, MD 09/07/2021 10:38:11 AM This report has been signed electronically.

## 2021-09-07 NOTE — Progress Notes (Signed)
History & Physical  Primary Care Physician:  [facility] (Inactive) Primary Gastroenterologist: Lucio Edward, MD  CHIEF COMPLAINT:  Personal history of colon polyps   HPI: Mikayla Richardson is a 51 y.o. female with a history of 13 sessile serrated polyps on colonoscopy in September 2021 here for surveillance colonoscopy.    Past Medical History:  Diagnosis Date   Allergy    Anxiety    Arthritis    knee   Asthma    Blood transfusion without reported diagnosis    1991 after childbirth   Colon polyps    GERD (gastroesophageal reflux disease)    Hyperlipidemia    Thyroid disease    UTI (urinary tract infection)     Past Surgical History:  Procedure Laterality Date   APPENDECTOMY     BACK SURGERY     BACK SURGERY     CHOLECYSTECTOMY     COLONOSCOPY     GANGLION CYST EXCISION     right wrist 1992   HERNIA REPAIR     TUBAL LIGATION      Prior to Admission medications   Medication Sig Start Date End Date Taking? Authorizing Provider  albuterol (VENTOLIN HFA) 108 (90 Base) MCG/ACT inhaler Inhale 1-2 puffs into the lungs every 4 (four) hours as needed for wheezing or shortness of breath.   Yes [provider]  budesonide-formoterol (SYMBICORT) 80-4.5 MCG/ACT inhaler Inhale 2 puffs into the lungs 2 (two) times daily. 01/24/21  Yes Brand Males, MD  fluticasone (FLONASE) 50 MCG/ACT nasal spray Place into both nostrils daily. 01/17/21  Yes [provider]  levothyroxine (SYNTHROID) 100 MCG tablet Take by mouth daily before breakfast. 03/30/21  Yes [provider]  loratadine (CLARITIN) 10 MG tablet daily. 02/23/14  Yes [provider]  montelukast (SINGULAIR) 10 MG tablet Take 10 mg by mouth at bedtime.   Yes [provider]  omeprazole (PRILOSEC) 40 MG capsule TAKE 1 CAPSULE BY MOUTH TWICE A DAY 03/18/18  Yes Ladene Artist, MD  rosuvastatin (CRESTOR) 20 MG tablet Take 1 tablet by mouth daily. 01/17/21  Yes  [provider]  albuterol (PROVENTIL) (2.5 MG/3ML) 0.083% nebulizer solution Take 3 mLs (2.5 mg total) by nebulization every 6 (six) hours as needed for wheezing or shortness of breath. 01/23/18   Parrett, Fonnie Mu, NP    Current Outpatient Medications  Medication Sig Dispense Refill   albuterol (VENTOLIN HFA) 108 (90 Base) MCG/ACT inhaler Inhale 1-2 puffs into the lungs every 4 (four) hours as needed for wheezing or shortness of breath.     budesonide-formoterol (SYMBICORT) 80-4.5 MCG/ACT inhaler Inhale 2 puffs into the lungs 2 (two) times daily. 10.2 g 6   fluticasone (FLONASE) 50 MCG/ACT nasal spray Place into both nostrils daily.     levothyroxine (SYNTHROID) 100 MCG tablet Take by mouth daily before breakfast.     loratadine (CLARITIN) 10 MG tablet daily.     montelukast (SINGULAIR) 10 MG tablet Take 10 mg by mouth at bedtime.     omeprazole (PRILOSEC) 40 MG capsule TAKE 1 CAPSULE BY MOUTH TWICE A DAY 180 capsule 1   rosuvastatin (CRESTOR) 20 MG tablet Take 1 tablet by mouth daily.     albuterol (PROVENTIL) (2.5 MG/3ML) 0.083% nebulizer solution Take 3 mLs (2.5 mg total) by nebulization every 6 (six) hours as needed for wheezing or shortness of breath. 75 mL 3   Current Facility-Administered Medications  Medication Dose Route Frequency Provider Last Rate Last Admin  0.9 %  sodium chloride infusion  500 mL Intravenous Once Ladene Artist, MD        Allergies as of 09/07/2021   (No Known Allergies)    Family History  Problem Relation Age of Onset   Heart attack Mother    Hypertension Mother    Colon cancer Father    Diabetes Father    Heart disease Father    Asthma Sister    Colon cancer Brother    Stomach cancer Brother    Stomach cancer Brother    Esophageal cancer Neg Hx    Rectal cancer Neg Hx     Social History   Socioeconomic History   Marital status: Divorced    Spouse name: Not on file   Number of children: 2   Years of education: Not on file    Highest education level: Not on file  Occupational History   Occupation: Land  Tobacco Use   Smoking status: Former    Packs/day: 1.00    Years: 15.00    Pack years: 15.00    Types: Cigarettes    Quit date: 06/16/2020    Years since quitting: 1.2   Smokeless tobacco: Never   Tobacco comments:    Quit smoking as of 06/2020    05/02/2021 TH  Vaping Use   Vaping Use: Never used  Substance and Sexual Activity   Alcohol use: Yes    Comment: rarely   Drug use: No   Sexual activity: Yes    Birth control/protection: Surgical  Other Topics Concern   Not on file  Social History Narrative   Not on file   Social Determinants of Health   Financial Resource Strain: Not on file  Food Insecurity: Not on file  Transportation Needs: Not on file  Physical Activity: Not on file  Stress: Not on file  Social Connections: Not on file  Intimate Partner Violence: Not on file    Review of Systems:  All systems reviewed an negative except where noted in HPI.  Gen: Denies any fever, chills, sweats, anorexia, fatigue, weakness, malaise, weight loss, and sleep disorder CV: Denies chest pain, angina, palpitations, syncope, orthopnea, PND, peripheral edema, and claudication. Resp: Denies dyspnea at rest, dyspnea with exercise, cough, sputum, wheezing, coughing up blood, and pleurisy. GI: Denies vomiting blood, jaundice, and fecal incontinence.   Denies dysphagia or odynophagia. GU : Denies urinary burning, blood in urine, urinary frequency, urinary hesitancy, nocturnal urination, and urinary incontinence. MS: Denies joint pain, limitation of movement, and swelling, stiffness, low back pain, extremity pain. Denies muscle weakness, cramps, atrophy.  Derm: Denies rash, itching, dry skin, hives, moles, warts, or unhealing ulcers.  Psych: Denies depression, anxiety, memory loss, suicidal ideation, hallucinations, paranoia, and confusion. Heme: Denies bruising, bleeding, and enlarged lymph  nodes. Neuro:  Denies any headaches, dizziness, paresthesias. Endo:  Denies any problems with DM, thyroid, adrenal function.   Physical Exam: General:  Alert, well-developed, in NAD Head:  Normocephalic and atraumatic. Eyes:  Sclera clear, no icterus.   Conjunctiva pink. Ears:  Normal auditory acuity. Mouth:  No deformity or lesions.  Neck:  Supple; no masses . Lungs:  Clear throughout to auscultation.   No wheezes, crackles, or rhonchi. No acute distress. Heart:  Regular rate and rhythm; no murmurs. Abdomen:  Soft, nondistended, nontender. No masses, hepatomegaly. No obvious masses.  Normal bowel .    Rectal:  Deferred   Msk:  Symmetrical without gross deformities.. Pulses:  Normal pulses noted. Extremities:  Without edema. Neurologic:  Alert and  oriented x4;  grossly normal neurologically. Skin:  Intact without significant lesions or rashes. Cervical Nodes:  No significant cervical adenopathy. Psych:  Alert and cooperative. Normal mood and affect.   Impression / Plan:   51 y.o. female with a history of 13 sessile serrated polyps on colonoscopy in September 2021 here for surveillance colonoscopy.    Pricilla Riffle. Fuller Plan  09/07/2021, 10:02 AM See Shea Evans, [facility], to contact our on call provider

## 2021-09-07 NOTE — Patient Instructions (Signed)
Information on polyps and hemorrhoids given to you today.  Await pathology results.  Resume previous diet and medications.   YOU HAD AN ENDOSCOPIC PROCEDURE TODAY AT [facility]:   Refer to the procedure report that was given to you for any specific questions about what was found during the examination.  If the procedure report does not answer your questions, please call your gastroenterologist to clarify.  If you requested that your care partner not be given the details of your procedure findings, then the procedure report has been included in a sealed envelope for you to review at your convenience later.  YOU SHOULD EXPECT: Some feelings of bloating in the abdomen. Passage of more gas than usual.  Walking can help get rid of the air that was put into your GI tract during the procedure and reduce the bloating. If you had a lower endoscopy (such as a colonoscopy or flexible sigmoidoscopy) you may notice spotting of blood in your stool or on the toilet paper. If you underwent a bowel prep for your procedure, you may not have a normal bowel movement for a few days.  Please Note:  You might notice some irritation and congestion in your nose or some drainage.  This is from the oxygen used during your procedure.  There is no need for concern and it should clear up in a day or so.  SYMPTOMS TO REPORT IMMEDIATELY:  Following lower endoscopy (colonoscopy or flexible sigmoidoscopy):  Excessive amounts of blood in the stool  Significant tenderness or worsening of abdominal pains  Swelling of the abdomen that is new, acute  Fever of 100F or higher   For urgent or emergent issues, a gastroenterologist can be reached at any hour by calling [phone]. Do not use MyChart messaging for urgent concerns.    DIET:  We do recommend a small meal at first, but then you may proceed to your regular diet.  Drink plenty of fluids but you should avoid alcoholic beverages for 24  hours.  ACTIVITY:  You should plan to take it easy for the rest of today and you should NOT DRIVE or use heavy machinery until tomorrow (because of the sedation medicines used during the test).    FOLLOW UP: Our staff will call the number listed on your records 48-72 hours following your procedure to check on you and address any questions or concerns that you may have regarding the information given to you following your procedure. If we do not reach you, we will leave a message.  We will attempt to reach you two times.  During this call, we will ask if you have developed any symptoms of COVID 19. If you develop any symptoms (ie: fever, flu-like symptoms, shortness of breath, cough etc.) before then, please call [phone].  If you test positive for Covid 19 in the 2 weeks post procedure, please call and report this information to [place].    If any biopsies were taken you will be contacted by phone or by letter within the next 1-3 weeks.  Please call us at [phone] if you have not heard about the biopsies in 3 weeks.    SIGNATURES/CONFIDENTIALITY: You and/or your care partner have signed paperwork which will be entered into your electronic medical record.  These signatures attest to the fact that that the information above on your After Visit Summary has been reviewed and is understood.  Full responsibility of the confidentiality of this discharge information lies with you and/or  your care-partner.

## 2021-09-07 NOTE — Progress Notes (Signed)
VSS, transported to PACU °

## 2021-09-08 ENCOUNTER — Other Ambulatory Visit: Payer: Self-pay | Admitting: Acute Care

## 2021-09-08 DIAGNOSIS — Z87891 Personal history of nicotine dependence: Secondary | ICD-10-CM

## 2021-09-09 ENCOUNTER — Telehealth: Payer: Self-pay | Admitting: *Deleted

## 2021-09-09 NOTE — Telephone Encounter (Signed)
No answer for second post procedure call back.

## 2021-09-09 NOTE — Telephone Encounter (Signed)
No answer on first follow up call. Left message  °

## 2021-09-16 ENCOUNTER — Encounter: Payer: Self-pay | Admitting: Gastroenterology

## 2021-10-21 ENCOUNTER — Telehealth: Payer: Self-pay

## 2021-10-21 NOTE — Telephone Encounter (Addendum)
Fax received from Dr. Edmonia Lynch with [facility] to perform a Left total knee replacement on patient.  Patient needs surgery clearance. Patient was seen on 05/02/2021. She has been called to schedule an OV. Office protocol is a risk assessment can be sent to surgeon if patient has been seen in 60 days or less.  ? ?Sending to [facility]  for risk assessment or recommendations if patient needs to be seen in office prior to surgical procedure.   ? ?Next Appt ?With Pulmonology Rexene Edison, NP) ?11/01/2021 at 11:00 AM ? ? ?

## 2021-10-24 NOTE — Telephone Encounter (Signed)
Has ov next week can discuss at this visit  ?

## 2021-10-24 NOTE — Telephone Encounter (Signed)
Routing surgical clearance pool. ? ?

## 2021-11-01 ENCOUNTER — Ambulatory Visit: Payer: 59 | Admitting: Adult Health

## 2021-11-01 ENCOUNTER — Other Ambulatory Visit: Payer: Self-pay

## 2021-11-01 ENCOUNTER — Encounter: Payer: Self-pay | Admitting: Adult Health

## 2021-11-01 VITALS — BP 130/68 | HR 92 | Temp 97.9°F | Ht 64.0 in | Wt 178.0 lb

## 2021-11-01 DIAGNOSIS — Z01811 Encounter for preprocedural respiratory examination: Secondary | ICD-10-CM

## 2021-11-01 DIAGNOSIS — R059 Cough, unspecified: Secondary | ICD-10-CM | POA: Diagnosis not present

## 2021-11-01 DIAGNOSIS — J4531 Mild persistent asthma with (acute) exacerbation: Secondary | ICD-10-CM | POA: Diagnosis not present

## 2021-11-01 DIAGNOSIS — R911 Solitary pulmonary nodule: Secondary | ICD-10-CM

## 2021-11-01 DIAGNOSIS — J302 Other seasonal allergic rhinitis: Secondary | ICD-10-CM | POA: Diagnosis not present

## 2021-11-01 NOTE — Patient Instructions (Addendum)
Continue on Claritin and Singulair daily.  ?Continue on Symbicort 2 puffs Twice daily , rinse after use.  ?Albuterol inhaler As needed   ?Good luck with upcoming surgery  ?Activity as tolerated.  ?Follow up with Dr. Chase Caller or Briseis Aguilera in 1 year and As needed   ?Please contact office for sooner follow up if symptoms do not improve or worsen or seek emergency care  ? ? ?

## 2021-11-01 NOTE — Assessment & Plan Note (Signed)
Excellent control on maintenance regimen.  Patient is continue on Symbicort.  Albuterol as needed.  Asthma action plan discussed. ?Trigger prevention ? ?Plan  ?Patient Instructions  ?Continue on Claritin and Singulair daily.  ?Continue on Symbicort 2 puffs Twice daily , rinse after use.  ?Albuterol inhaler As needed   ?Good luck with upcoming surgery  ?Activity as tolerated.  ?Follow up with Dr. Chase Caller or Ellyssa Zagal in 1 year and As needed   ?Please contact office for sooner follow up if symptoms do not improve or worsen or seek emergency care  ? ? ?  ? ?

## 2021-11-01 NOTE — Assessment & Plan Note (Signed)
Very small pulmonary nodule scattered on CT scan.  Patient participates in the lung cancer screening program.  She is to continue with yearly screening.  Congratulated on smoking cessation. ?

## 2021-11-01 NOTE — Assessment & Plan Note (Signed)
Currently well controlled-continue on current regimen\ ? ?Plan  ?Patient Instructions  ?Continue on Claritin and Singulair daily.  ?Continue on Symbicort 2 puffs Twice daily , rinse after use.  ?Albuterol inhaler As needed   ?Good luck with upcoming surgery  ?Activity as tolerated.  ?Follow up with Dr. Chase Caller or Harlem Bula in 1 year and As needed   ?Please contact office for sooner follow up if symptoms do not improve or worsen or seek emergency care  ? ? ?  ?

## 2021-11-01 NOTE — Assessment & Plan Note (Signed)
Pulmonary preop risk assessment.  Patient has upcoming left knee replacement surgery. ?Patient is fully independent.  Currently asthma appears to be under good control.  Spirometry today in office shows normal lung function.  Patient has quit smoking.  Chronic allergies are under good control. ?Patient would be considered a mild preop risk- ? ? ?Major Pulmonary risks identified in the multifactorial risk analysis are but not limited to a) pneumonia; b) recurrent intubation risk; c) prolonged or recurrent acute respiratory failure needing mechanical ventilation; d) prolonged hospitalization; e) DVT/Pulmonary embolism; f) Acute Pulmonary edema ? ?Recommend ?1. Short duration of surgery as much as possible and avoid paralytic if possible ?2. Recovery in step down or ICU with Pulmonary consultation if indicated  ?3. DVT prophylaxis ?4. Aggressive pulmonary toilet with o2, bronchodilatation, and incentive spirometry and early ambulation ? ? ? ?

## 2021-11-01 NOTE — Progress Notes (Signed)
? ?'@Patient'$  ID: Mikayla Richardson, female    DOB: 1971/04/13, 51 y.o.   MRN: [id number] ? ?Chief Complaint  ?Patient presents with  ? Follow-up  ? ? ?Referring provider: ?No ref. provider found ? 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 ?TEST/EVENTS :  ?Pulmonary function testing done June 2019 showed normal lung function with an FEV1 at 103%, ratio 82, FVC 99%, positive bronchodilator response.,  DLCO 80%. ?  ?High-resolution CT chest January 11, 2018 showed no evidence of interstitial lung disease.  Solid 3 mm apical right upper lobe nodule. ?  ?CT chest March 05, 2020 showed biapical pleural-parenchymal scarring, mild emphysema, noncalcified pulmonary nodules measuring 4 mm or less.  Unchanged and consistent with benign etiology. ? ?Lung cancer screening CT chest September 06, 2021 lung RADS 2 with benign appearance, diffuse bronchial wall thickening with emphysema, several small pulmonary nodules identified largest measuring 3.5 mm ? ?11/01/2021 Follow up ; Asthma and AR ,Cough  ?Patient presents for a 55-monthfollow-up.  Patient has underlying asthma, emphysema.  Says overall she is doing well.  She remains on Symbicort twice daily.  She also takes Claritin and Singulair for chronic allergies.  Patient denies any flare of cough or wheezing. ?No increased albuterol use. Much better since stopping smoking .  ?Patient participates in the lung cancer screening program this was completed January 2023 that showed lung RADS 2 with benign appearance.  See report above.  ?Spirometry today shows normal lung function .  ? ?Patient has undergoing a left total knee replacement with Dr. TEdmonia Lynch  She needs pulmonary risk assessment. ?Patient is fully independent.  Can drive.  Works fulltime at [employer] . Works 3rd shift.  Previous pulmonary function testing in 2019 was normal with no airflow  obstruction or restriction.  She does have a positive bronchodilator response consistent with underlying asthma. ? ? ? ? ? ? ?No Known Allergies ? ?Immunization History  ?Administered Date(s) Administered  ? Influenza Split 06/19/2017  ? Influenza,inj,Quad PF,6+ Mos 05/14/2018  ? PPension scheme manager142yr& up 08/24/2021  ? Pneumococcal Polysaccharide-23 01/08/2013  ? ? ?Past Medical History:  ?Diagnosis Date  ? Allergy   ? Anxiety   ? 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 Asthma   ? Blood transfusion without reported diagnosis   ? 1991 after childbirth  ? Colon polyps   ? GERD (gastroesophageal reflux disease)   ? Hyperlipidemia   ? Thyroid disease   ? UTI (urinary tract infection)   ? ? ?Tobacco History: ?Social History  ? ?Tobacco Use  ?Smoking Status Former  ? Packs/day: 1.00  ? Years: 15.00  ? Pack years: 15.00  ? Types: Cigarettes  ? Quit date: 06/16/2020  ? Years since quitting: 1.3  ?Smokeless Tobacco Never  ?Tobacco Comments  ? Quit smoking as of 06/2020  ? 05/02/2021 [facility]  ? ?Counseling given: Not Answered ?Tobacco comments: Quit smoking as of 06/2020 ?05/02/2021 [facility] ? ? ?Outpatient Medications Prior to Visit  ?Medication Sig Dispense Refill  ? albuterol (PROVENTIL) (2.5 MG/3ML) 0.083% nebulizer solution Take 3 mLs (2.5 mg total) by nebulization every 6 (six) hours as needed for wheezing or shortness of breath. 75 mL 3  ? albuterol (VENTOLIN HFA) 108 (90 Base) MCG/ACT inhaler Inhale 1-2 puffs into the lungs every 4 (four) hours as needed for wheezing or shortness of breath.    ?  budesonide-formoterol (SYMBICORT) 80-4.5 MCG/ACT inhaler Inhale 2 puffs into the lungs 2 (two) times daily. 10.2 g 6  ? fluticasone (FLONASE) 50 MCG/ACT nasal spray Place into both nostrils daily.    ? levothyroxine (SYNTHROID) 100 MCG tablet Take by mouth daily before breakfast.    ? loratadine (CLARITIN) 10 MG tablet daily.    ? montelukast (SINGULAIR) 10 MG tablet Take 10 mg by mouth at bedtime.    ? omeprazole (PRILOSEC) 40  MG capsule TAKE 1 CAPSULE BY MOUTH TWICE A DAY 180 capsule 1  ? rosuvastatin (CRESTOR) 20 MG tablet Take 1 tablet by mouth daily.    ? ?No facility-administered medications prior to visit.  ? ? ? ?Review of Systems:  ? ?Constitutional:   No  weight loss, night sweats,  Fevers, chills, fatigue, or  lassitude. ? ?HEENT:   No headaches,  Difficulty swallowing,  Tooth/dental problems, or  Sore throat,  ?              No sneezing, itching, ear ache,  ?+nasal congestion, post nasal drip,  ? ?CV:  No chest pain,  Orthopnea, PND, swelling in lower extremities, anasarca, dizziness, palpitations, syncope.  ? ?GI  No heartburn, indigestion, abdominal pain, nausea, vomiting, diarrhea, change in bowel habits, loss of appetite, bloody stools.  ? ?Resp: No shortness of breath with exertion or at rest.  No excess mucus, no productive cough,  No non-productive cough,  No coughing up of blood.  No change in color of mucus.  No wheezing.  No chest wall deformity ? ?Skin: no rash or lesions. ? ?GU: no dysuria, change in color of urine, no urgency or frequency.  No flank pain, no hematuria  ? ?MS:  Left knee pain  ? ? ? ?Physical Exam ? ?BP 130/68 (BP Location: Left Arm, Cuff Size: Normal)   Pulse 92   Temp 97.9 ?F (36.6 ?C) (Temporal)   Ht '5\' 4"'$  (1.626 m)   Wt 178 lb (80.7 kg)   LMP 06/04/2012   SpO2 97%   BMI 30.55 kg/m?  ? ?GEN: A/Ox3; pleasant , NAD, well nourished  ?  ?HEENT:  [place]/AT,  NOSE-clear, THROAT-clear, no lesions, no postnasal drip or exudate noted.   ? ?NECK:  Supple w/ fair ROM; no JVD; normal carotid impulses w/o bruits; no thyromegaly or nodules palpated; no lymphadenopathy.   ? ?RESP  Clear  P & A; w/o, wheezes/ rales/ or rhonchi. no accessory muscle use, no dullness to percussion ? ?CARD:  RRR, no m/r/g, no peripheral edema, pulses intact, no cyanosis or clubbing. ? ?GI:   Soft & nt; nml bowel sounds; no organomegaly or masses detected.  ? ?Musco: Warm bil, no deformities or joint swelling noted.  ? ?Neuro:  alert, no focal deficits noted.   ? ?Skin: Warm, no lesions or rashes ? ? ? ?Lab Results: ? ? ?BNP ?No results found for: BNP ? ?ProBNP ?No results found for: PROBNP ? ?Imaging: ?No results found. ? ? ? ? ?  Latest Ref Rng & Units 01/23/2018  ?  9:53 AM  ?PFT Results  ?FVC-Pre L 3.37    ?FVC-Predicted Pre % 93    ?FVC-Post L 3.60    ?FVC-Predicted Post % 99    ?Pre FEV1/FVC % % 78    ?Post FEV1/FCV % % 82    ?FEV1-Pre L 2.64    ?FEV1-Predicted Pre % 91    ?FEV1-Post L 2.96    ?DLCO uncorrected ml/min/mmHg 20.01    ?DLCO UNC% %  80    ?DLVA Predicted % 76    ?TLC L 5.93    ?TLC % Predicted % 115    ?RV % Predicted % 149    ? ? ?Lab Results  ?Component Value Date  ? NITRICOXIDE 18 01/02/2018  ? ? ? ? ? ? ?Assessment & Plan:  ? ?Asthma ?Excellent control on maintenance regimen.  Patient is continue on Symbicort.  Albuterol as needed.  Asthma action plan discussed. ?Trigger prevention ? ?Plan  ?Patient Instructions  ?Continue on Claritin and Singulair daily.  ?Continue on Symbicort 2 puffs Twice daily , rinse after use.  ?Albuterol inhaler As needed   ?Good luck with upcoming surgery  ?Activity as tolerated.  ?Follow up with Dr. Chase Caller or Jesten Cappuccio in 1 year and As needed   ?Please contact office for sooner follow up if symptoms do not improve or worsen or seek emergency care  ? ? ?  ? ? ?Allergic rhinitis ?Currently well controlled-continue on current regimen\ ? ?Plan  ?Patient Instructions  ?Continue on Claritin and Singulair daily.  ?Continue on Symbicort 2 puffs Twice daily , rinse after use.  ?Albuterol inhaler As needed   ?Good luck with upcoming surgery  ?Activity as tolerated.  ?Follow up with Dr. Chase Caller or Jamyria Ozanich in 1 year and As needed   ?Please contact office for sooner follow up if symptoms do not improve or worsen or seek emergency care  ? ? ?  ? ?Preop pulmonary/respiratory exam ?Pulmonary preop risk assessment.  Patient has upcoming left knee replacement surgery. ?Patient is fully independent.   Currently asthma appears to be under good control.  Spirometry today in office shows normal lung function.  Patient has quit smoking.  Chronic allergies are under good control. ?Patient would be considered a mild preop risk- ? ?

## 2021-11-02 NOTE — Telephone Encounter (Signed)
Ov notes and clearance form have been faxed back [facility]. Nothing further needed at this time.  ?

## 2021-11-23 ENCOUNTER — Ambulatory Visit: Payer: 59 | Admitting: Podiatry

## 2021-11-23 ENCOUNTER — Telehealth: Payer: Self-pay | Admitting: Podiatry

## 2021-11-23 ENCOUNTER — Encounter: Payer: Self-pay | Admitting: Podiatry

## 2021-11-23 DIAGNOSIS — Z79899 Other long term (current) drug therapy: Secondary | ICD-10-CM | POA: Diagnosis not present

## 2021-11-23 DIAGNOSIS — B351 Tinea unguium: Secondary | ICD-10-CM

## 2021-11-23 MED ORDER — TERBINAFINE HCL 250 MG PO TABS: 250.0000 mg | ORAL_TABLET | Freq: Every day | ORAL | 0 refills | Status: DC

## 2021-11-23 NOTE — Patient Instructions (Signed)
Terbinafine Tablets What is this medication? TERBINAFINE (TER bin a feen) treats fungal infections of the nails. It belongs to a group of medications called antifungals. It will not treat infections caused by bacteria or viruses. This medicine may be used for other purposes; ask your health care provider or pharmacist if you have questions. COMMON BRAND NAME(S): Lamisil, Terbinex What should I tell my care team before I take this medication? They need to know if you have any of these conditions: Liver disease An unusual or allergic reaction to terbinafine, other medications, foods, dyes, or preservatives Pregnant or trying to get pregnant Breast-feeding How should I use this medication? Take this medication by mouth with water. Take it as directed on the prescription label at the same time every day. You can take it with or without food. If it upsets your stomach, take it with food. Keep taking it unless your care team tells you to stop. A special MedGuide will be given to you by the pharmacist with each prescription and refill. Be sure to read this information carefully each time. Talk to your care team regarding the use of this medication in children. Special care may be needed. Overdosage: If you think you have taken too much of this medicine contact a poison control center or emergency room at once. NOTE: This medicine is only for you. Do not share this medicine with others. What if I miss a dose? If you miss a dose, take it as soon as you can unless it is more than 4 hours late. If it is more than 4 hours late, skip the missed dose. Take the next dose at the normal time. What may interact with this medication? Do not take this medication with any of the following: Pimozide Thioridazine This medication may also interact with the following: Beta blockers Caffeine Certain medications for mental health conditions Cimetidine Cyclosporine Medications for fungal infections like fluconazole  and ketoconazole Medications for irregular heartbeat like amiodarone, flecainide and propafenone Rifampin Warfarin This list may not describe all possible interactions. Give your health care provider a list of all the medicines, herbs, non-prescription drugs, or dietary supplements you use. Also tell them if you smoke, drink alcohol, or use illegal drugs. Some items may interact with your medicine. What should I watch for while using this medication? Visit your care team for regular checks on your progress. You may need blood work while you are taking this medication. It may be some time before you see the benefit from this medication. This medication may cause serious skin reactions. They can happen weeks to months after starting the medication. Contact your care team right away if you notice fevers or flu-like symptoms with a rash. The rash may be red or purple and then turn into blisters or peeling of the skin. Or, you might notice a red rash with swelling of the face, lips or lymph nodes in your neck or under your arms. This medication can make you more sensitive to the sun. Keep out of the sun, If you cannot avoid being in the sun, wear protective clothing and sunscreen. Do not use sun lamps or tanning beds/booths. What side effects may I notice from receiving this medication? Side effects that you should report to your care team as soon as possible: Allergic reactions--skin rash, itching, hives, swelling of the face, lips, tongue, or throat Change in sense of smell Change in taste Infection--fever, chills, cough, or sore throat Liver injury--right upper belly pain, loss of appetite, nausea,   light-colored stool, dark yellow or brown urine, yellowing skin or eyes, unusual weakness or fatigue Low red blood cell level--unusual weakness or fatigue, dizziness, headache, trouble breathing Lupus-like syndrome--joint pain, swelling, or stiffness, butterfly-shaped rash on the face, rashes that get worse  in the sun, fever, unusual weakness or fatigue Rash, fever, and swollen lymph nodes Redness, blistering, peeling, or loosening of the skin, including inside the mouth Unusual bruising or bleeding Worsening mood, feelings of depression Side effects that usually do not require medical attention (report to your care team if they continue or are bothersome): Diarrhea Gas Headache Nausea Stomach pain Upset stomach This list may not describe all possible side effects. Call your doctor for medical advice about side effects. You may report side effects to FDA at 1-800-FDA-1088. Where should I keep my medication? Keep out of the reach of children and pets. Store between 20 and 25 degrees C (68 and 77 degrees F). Protect from light. Get rid of any unused medication after the expiration date. To get rid of medications that are no longer needed or have expired: Take the medication to a medication take-back program. Check with your pharmacy or law enforcement to find a location. If you cannot return the medication, check the label or package insert to see if the medication should be thrown out in the garbage or flushed down the toilet. If you are not sure, ask your care team. If it is safe to put it in the trash, take the medication out of the container. Mix the medication with cat litter, dirt, coffee grounds, or other unwanted substance. Seal the mixture in a bag or container. Put it in the trash. NOTE: This sheet is a summary. It may not cover all possible information. If you have questions about this medicine, talk to your doctor, pharmacist, or health care provider.  2023 Elsevier/Gold Standard (2021-03-09 00:00:00)  

## 2021-11-25 NOTE — Telephone Encounter (Signed)
I have called to initiate the prior authorization. Case number [id number] and was told it can take up to 4 days. I have informed the patient.  ? ?Mikayla Richardson can you follow up on this in a few days if we don't hear anything? Thanks! ?

## 2021-11-26 NOTE — Progress Notes (Signed)
Subjective: ?51 year old female presents the office today for foot evaluation and nail fungus, ingrown toenail, pain to the right big toe worse than left.  She was not able to complete the course of Lamisil given insurance.  She is interested in trying the medication again.  She still does get thick causing discomfort.  No swelling redness or any drainage to the toenail sites.  She did try trimming the left big toenail and did cut her skin slightly and is somewhat sore but denies any swelling or redness or any signs of infection.  She has no other concerns today. ? ?Objective: ?AAO x3, NAD ?DP/PT pulses palpable bilaterally, CRT less than 3 seconds ?Right hallux worse than left nails hypertrophic, dystrophic with incurvation of the nail border.  There is no edema, erythema or signs of infection bilaterally.  No laceration noted today. ?No pain with calf compression, swelling, warmth, erythema ? ?Assessment: ?Onychomycosis, ingrown toenail ? ?Plan: ?-All treatment options discussed with the patient including all alternatives, risks, complications.  ?-For the ingrown toenail we discussed partial nail avulsion but ultimately decided to hold off on this today.  We did trim and debride the nail today without any complications or bleeding.  We discussed treatment options for nail fungus and she wants to try oral Lamisil again.  I will check a CBC and LFT prior to starting the medication.  Discussed side effects and any issues were to occur to stop taking it and let me know. ?-Patient encouraged to call the office with any questions, concerns, change in symptoms.  ? ?Trula Slade DPM ? ?

## 2021-11-28 ENCOUNTER — Ambulatory Visit: Payer: 59 | Admitting: Internal Medicine

## 2021-12-08 ENCOUNTER — Other Ambulatory Visit: Payer: Self-pay | Admitting: Podiatry

## 2021-12-08 MED ORDER — FLUCONAZOLE 150 MG PO TABS: 150.0000 mg | ORAL_TABLET | ORAL | 0 refills | Status: DC

## 2021-12-08 NOTE — Progress Notes (Signed)
Lamisil not covered by insurance and authorization denied.  I called the patient informed.  Can switch to fluconazole.  Discussed side effects.  There is any issues to stop taking the medicine and let me know. ?

## 2022-02-03 ENCOUNTER — Ambulatory Visit: Payer: 59 | Admitting: Podiatry

## 2022-02-03 ENCOUNTER — Encounter: Payer: Self-pay | Admitting: Podiatry

## 2022-02-03 DIAGNOSIS — L600 Ingrowing nail: Secondary | ICD-10-CM

## 2022-02-03 NOTE — Patient Instructions (Signed)

## 2022-02-05 NOTE — Progress Notes (Signed)
Subjective:   Patient ID: Mikayla Richardson, female   DOB: 51 y.o.   MRN: [id number]   HPI Patient presents with painful ingrown toenail deformity of the right hallux medial border that is been present for a long time she has been trying to cut it and trim it herself   ROS      Objective:  Physical Exam  Neurovascular status intact with patient found to have incurvated right hallux medial border with some damage to the overlying nailbed but the medial border is the most pain she has     Assessment:  Ingrown toenail deformity right hallux with also structural changes of the nailbed that are consistent with overall trauma Plan:  H&P discussed condition and I did review with her the causes of ingrown toenail and considerations for trying to fix this but ultimately she could lose the whole nail.  She understands this wants to proceed with surgery and at this point I allowed her to read then signed consent form infiltrated the right hallux 60 mg like Marcaine mixture sterile prep and using sterile instrumentation remove the medial border exposed matrix applied phenol 3 applications 30 seconds followed by alcohol lavage sterile dressing gave instructions on soaks and to wear dressing for 24 hours but take it off earlier if throbbing were to occur.  Encouraged her to call with questions concerns

## 2022-02-07 IMAGING — CT CT CHEST LUNG CANCER SCREENING LOW DOSE W/O CM
2 of 4 series · 15 of 36 positions shown, 18 images · non-contrast
Comparison: CT chest 03/05/2020

CLINICAL DATA: Lung cancer screening. Fifty-two pack-year history.
Former asymptomatic smoker.



[Series 4: lung thins 1.0 · axial · 0.69mm/px · z∈[-244,+31]mm · 12 of 303 slices shown, 15 images]
[im 14/303  mediastinal]
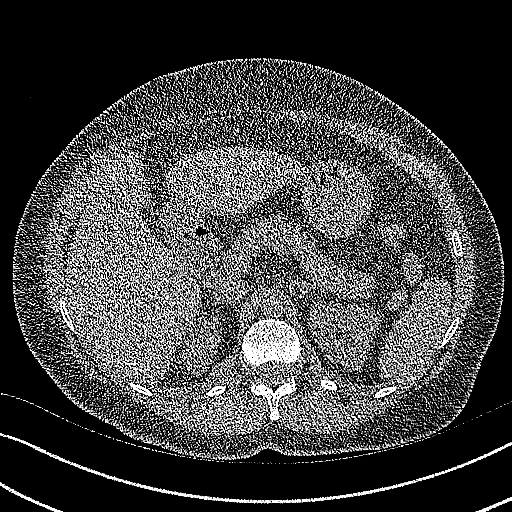
[im 14/303  lung]
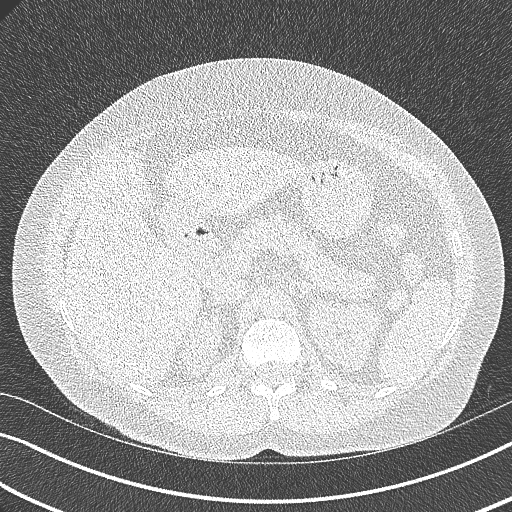
[im 42/303  lung]
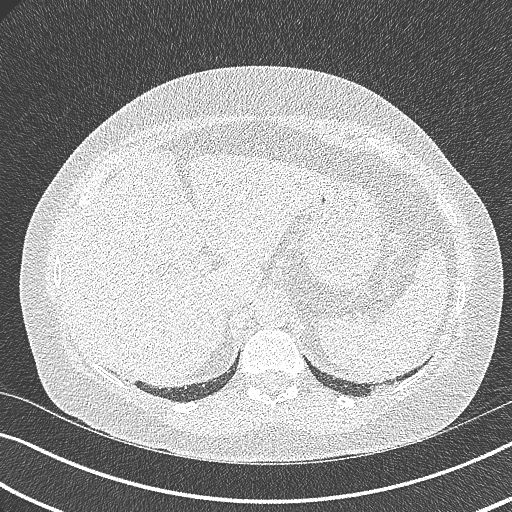
[im 69/303  lung]
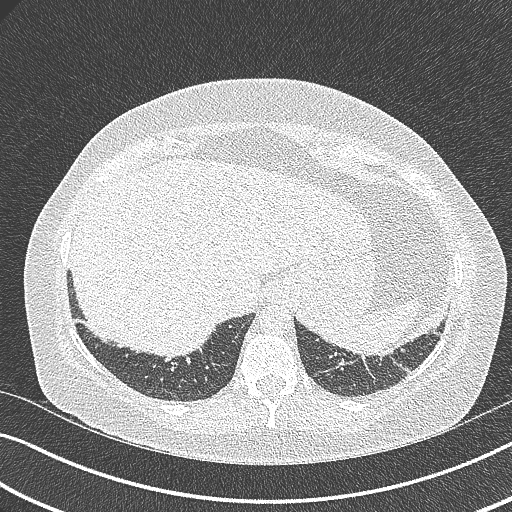
[im 97/303  lung]
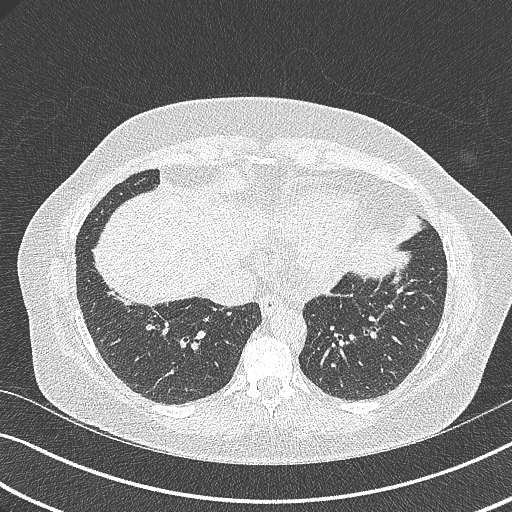
[im 110/303  mediastinal]
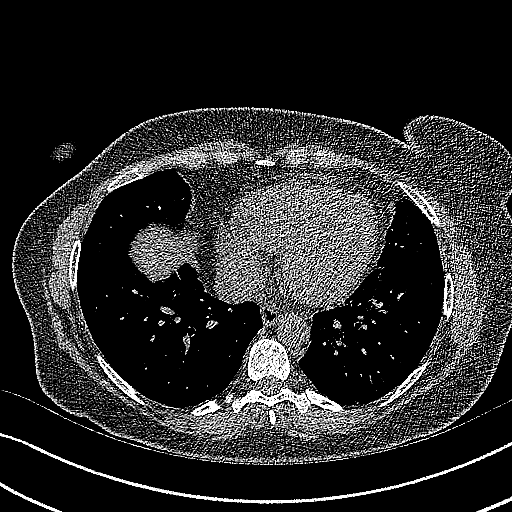
[im 110/303  lung]
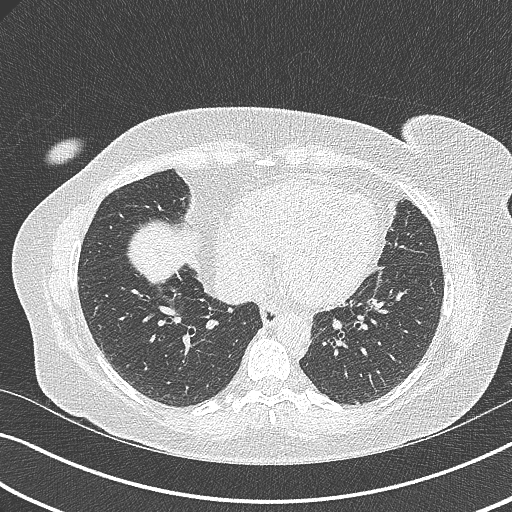
[im 138/303  lung]
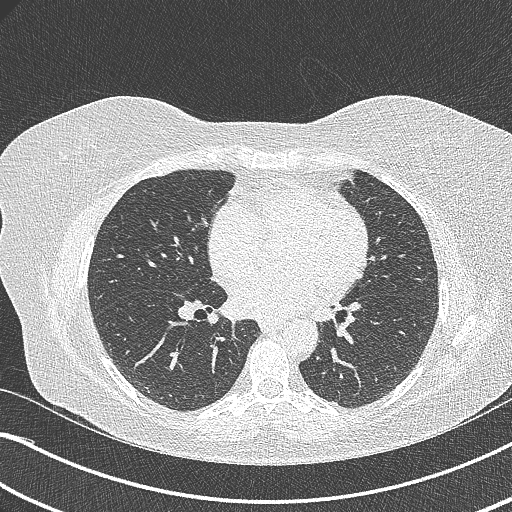
[im 165/303  lung]
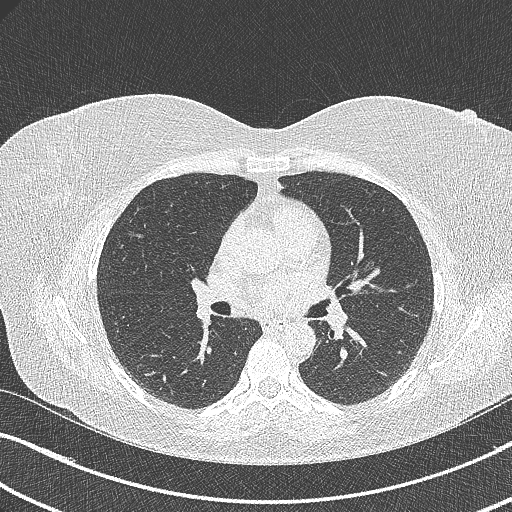
[im 193/303  lung]
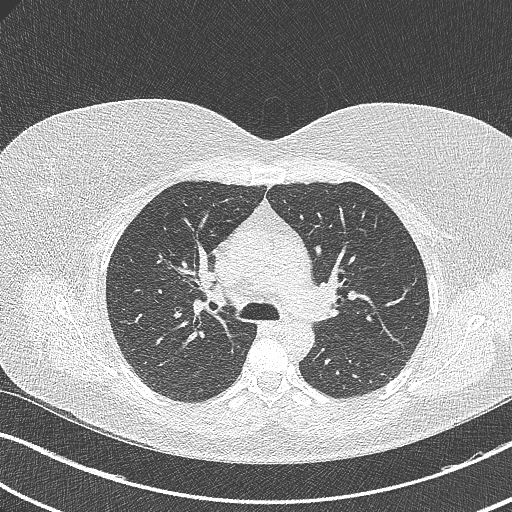
[im 206/303  mediastinal]
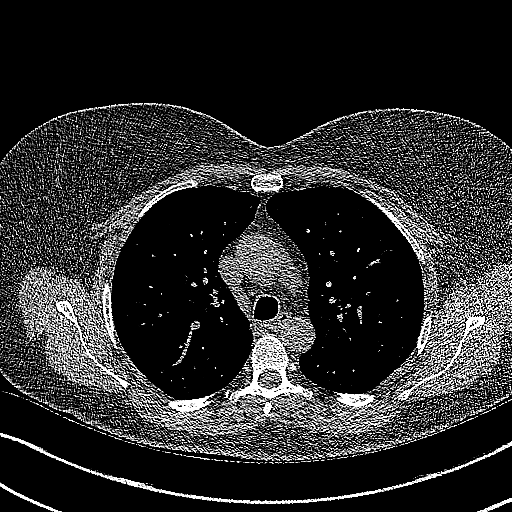
[im 206/303  lung]
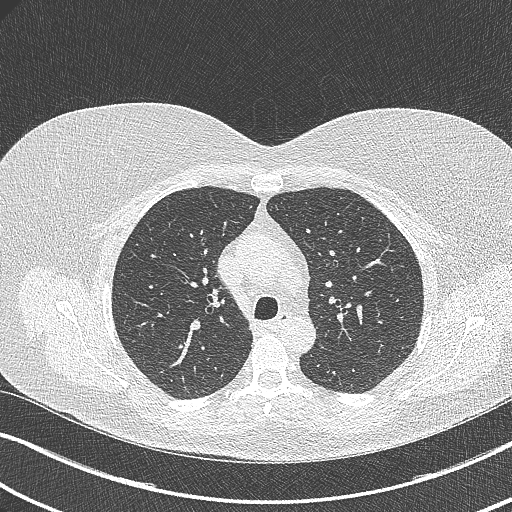
[im 234/303  lung]
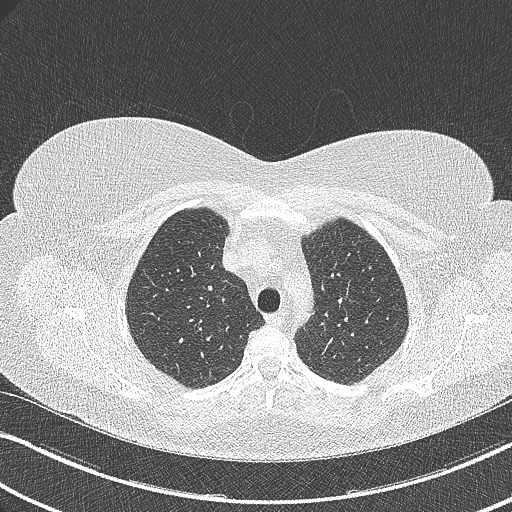
[im 261/303  lung]
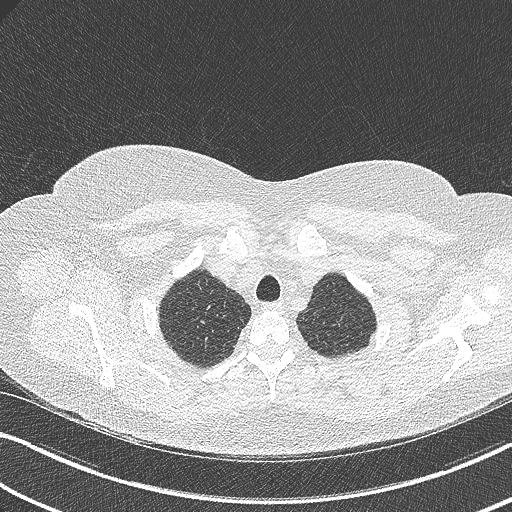
[im 289/303  lung]
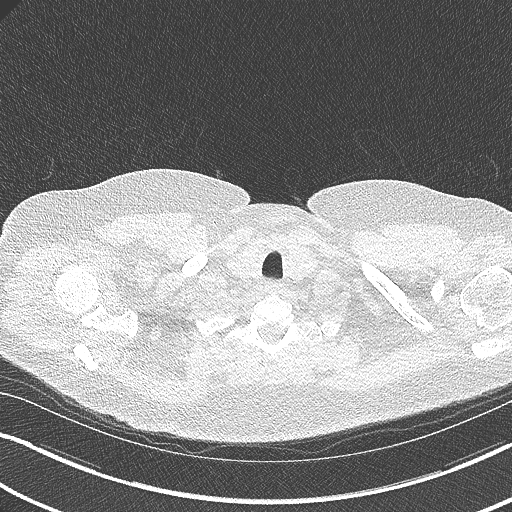

[Series 6: coronal · coronal · 0.59mm/px · 3 of 110 slices shown]
[im 22/110  lung]
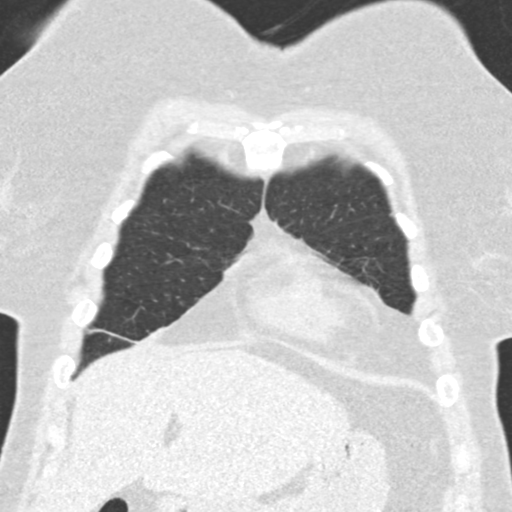
[im 44/110  lung]
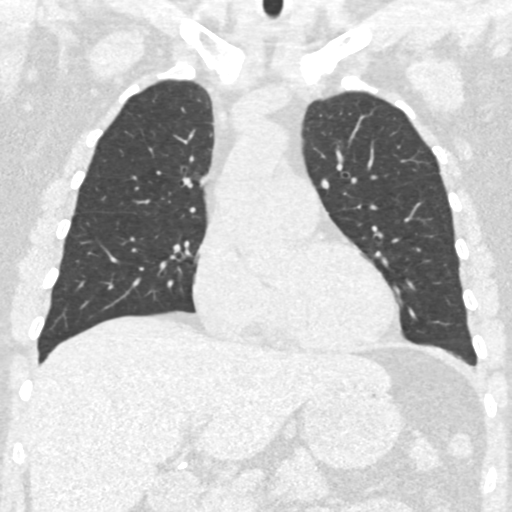
[im 66/110  lung]
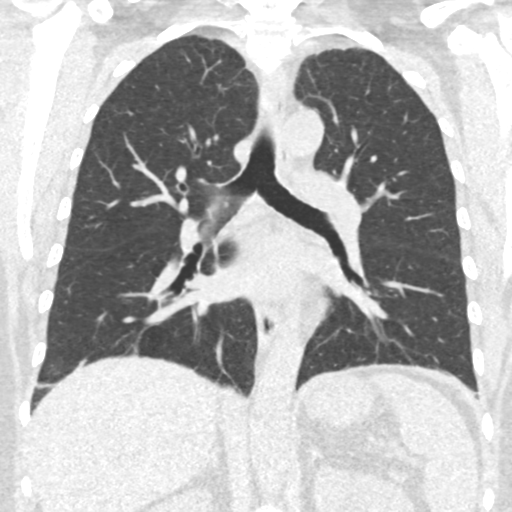

[15 of 36 positions shown; findings below may reference images not displayed]

FINDINGS: Cardiovascular: Heart size appears within normal limits. Mild aortic
atherosclerosis. Coronary artery atherosclerotic calcifications. No
pericardial effusion.

Mediastinum/Nodes: No enlarged mediastinal, hilar, or axillary lymph
nodes. Thyroid gland, trachea, and esophagus demonstrate no
significant findings.

Lungs/Pleura: Mild centrilobular and paraseptal emphysema with
diffuse bronchial wall thickening. There is no pleural effusion,
airspace consolidation, or pneumothorax. Parenchymal bands
identified within both lung bases appears similar to previous exam
and are likely postinflammatory. There are several small pulmonary
nodules identified. The largest is in the left upper lobe with a
mean derived diameter of 3.5 mm.

Upper Abdomen: No acute findings.  Status post cholecystectomy.

Musculoskeletal: No chest wall mass or suspicious bone lesions
identified.
IMPRESSION: 1. Lung-RADS 2, benign appearance or behavior. Continue annual
screening with low-dose chest CT without contrast in 12 months.
2. Coronary artery calcifications.
3. Diffuse bronchial wall thickening with emphysema, as above;
imaging findings suggestive of underlying COPD.
4. Aortic Atherosclerosis (JTYN2-Y13.3) and Emphysema (JTYN2-A51.R).

## 2022-07-13 ENCOUNTER — Encounter: Payer: Self-pay | Admitting: Adult Health

## 2022-07-13 ENCOUNTER — Ambulatory Visit: Payer: 59 | Admitting: Adult Health

## 2022-07-13 ENCOUNTER — Telehealth: Payer: Self-pay | Admitting: Adult Health

## 2022-07-13 VITALS — BP 110/78 | HR 71 | Ht 64.0 in | Wt 177.2 lb

## 2022-07-13 DIAGNOSIS — J4530 Mild persistent asthma, uncomplicated: Secondary | ICD-10-CM | POA: Diagnosis not present

## 2022-07-13 DIAGNOSIS — R911 Solitary pulmonary nodule: Secondary | ICD-10-CM

## 2022-07-13 DIAGNOSIS — J302 Other seasonal allergic rhinitis: Secondary | ICD-10-CM

## 2022-07-13 DIAGNOSIS — Z01811 Encounter for preprocedural respiratory examination: Secondary | ICD-10-CM

## 2022-07-13 NOTE — Patient Instructions (Signed)
Continue on Claritin and Singulair daily.  Continue on Symbicort 1-2  puffs Twice daily , rinse after use.  Albuterol inhaler /neb As needed   Good luck with upcoming surgery  Activity as tolerated.  Continue on Lung cancer CT screening program.  Follow up with Dr. Chase Caller or Aydin Cavalieri in 1 year and As needed   Please contact office for sooner follow up if symptoms do not improve or worsen or seek emergency care

## 2022-07-13 NOTE — Telephone Encounter (Signed)
Fax received from Dr. Edmonia Lynch with [facility] to perform a LEFT TOTAL KNEE REPLACEMENT on patient.  Patient needs surgery clearance. Surgery is 10/05/2022. Office protocol is a risk assessment can be sent to surgeon if patient has been seen in 60 days or less.   Sending to Rexene Edison NP  for risk assessment or recommendations if patient needs to be seen in office prior to surgical procedure.    Anesthesia: Spinal Unknown length of time for surgery  Tammy you saw this patient 07/13/2022 in office.

## 2022-07-13 NOTE — Progress Notes (Signed)
$'@Patient'B$  ID: Mikayla Richardson, female    DOB: 06-12-1971, 51 y.o.   MRN: [id number]  Chief Complaint  Patient presents with   Follow-up    Referring provider: No ref. provider found  HPI: 51 year old female former smoker followed for asthma, emphysema, chronic cough and lung nodule   TEST/EVENTS :  Pulmonary function testing done June 2019 showed normal lung function with an FEV1 at 103%, ratio 82, FVC 99%, positive bronchodilator response.,  DLCO 80%.   High-resolution CT chest January 11, 2018 showed no evidence of interstitial lung disease.  Solid 3 mm apical right upper lobe nodule.   CT chest March 05, 2020 showed biapical pleural-parenchymal scarring, mild emphysema, noncalcified pulmonary nodules measuring 4 mm or less.  Unchanged and consistent with benign etiology.   Lung cancer screening CT chest September 06, 2021 lung RADS 2 with benign appearance, diffuse bronchial wall thickening with emphysema, several small pulmonary nodules identified largest measuring 3.5 mm   07/13/2022 Follow up : Asthma, allergic rhinitis, cough, lung nodule, former smoker Patient returns for a follow-up visit.  Patient has underlying asthma and emphysema.  She remains on Symbicort 1 puff twice daily.  Patient says she is doing very well.  Since stopping smoking she feels like she has much more energy.  She is exercising just about every day for 45 minutes.  Her energy level and endurance has improved significantly.  She does have some limitations due to knee pain.  She had canceled a left knee replacement earlier this year due to family issues and this has been rescheduled for next year.  She is here for preop risk assessment.  She does remain on Claritin and Singulair.  She denies any increased albuterol use.  She has switched jobs from working for [employer] to the place department which she says is much better and much less stressful.  She is now working second shift which is also a lot less stress on her  body. Patient remains fully independent.  As above she works full-time.  Spirometry done earlier this year in March showed stable lung function. She denies any increased coughing, wheezing, chest pain.  She participates in the lung cancer CT screening program.  Last CT was January 2023 Was lung RADS 2 with benign appearance.  No Known Allergies  Immunization History  Administered Date(s) Administered   Covid-19, Mrna,Vaccine(Spikevax)70yr and older 05/24/2022   Influenza Split 05/24/2012, 05/07/2013, 06/19/2017   Influenza,inj,Quad PF,6+ Mos 05/14/2018, 08/13/2019, 05/24/2022   Influenza-Unspecified 05/24/2012, 05/07/2013, 06/19/2017   Pfizer Covid-19 Vaccine Bivalent Booster 137yr& up 08/24/2021   Pneumococcal Polysaccharide-23 01/08/2013   Tdap 08/29/2019, 08/17/2021    Past Medical History:  Diagnosis Date   Allergy    Anxiety    Arthritis    knee   Asthma    Blood transfusion without reported diagnosis    1991 after childbirth   Colon polyps    GERD (gastroesophageal reflux disease)    Hyperlipidemia    Thyroid disease    UTI (urinary tract infection)     Tobacco History: Social History   Tobacco Use  Smoking Status Former   Packs/day: 1.00   Years: 15.00   Total pack years: 15.00   Types: Cigarettes   Quit date: 06/16/2020   Years since quitting: 2.0  Smokeless Tobacco Never  Tobacco Comments   Quit smoking as of 06/2020   05/02/2021 TH   Counseling given: Not Answered Tobacco comments: Quit smoking as of 06/2020 05/02/2021 TH  Outpatient Medications Prior to Visit  Medication Sig Dispense Refill   albuterol (PROVENTIL) (2.5 MG/3ML) 0.083% nebulizer solution Take 3 mLs (2.5 mg total) by nebulization every 6 (six) hours as needed for wheezing or shortness of breath. 75 mL 3   albuterol (VENTOLIN HFA) 108 (90 Base) MCG/ACT inhaler Inhale 1-2 puffs into the lungs every 4 (four) hours as needed for wheezing or shortness of breath.      budesonide-formoterol (SYMBICORT) 80-4.5 MCG/ACT inhaler Inhale 2 puffs into the lungs 2 (two) times daily. 10.2 g 6   cetirizine (ZYRTEC) 10 MG tablet Take 1 tablet by mouth daily.     [employer] ACETAMINOPHEN EX ST 500 MG tablet SMARTSIG:2 Capsule(s) By Mouth Every 8 Hours PRN     fluticasone (FLONASE) 50 MCG/ACT nasal spray Place into both nostrils daily.     levothyroxine (SYNTHROID) 100 MCG tablet Take by mouth daily before breakfast.     loratadine (CLARITIN) 10 MG tablet daily.     montelukast (SINGULAIR) 10 MG tablet Take 10 mg by mouth at bedtime.     omeprazole (PRILOSEC) 40 MG capsule TAKE 1 CAPSULE BY MOUTH TWICE A DAY 180 capsule 1   rosuvastatin (CRESTOR) 20 MG tablet Take 1 tablet by mouth daily.     benzonatate (TESSALON) 200 MG capsule Take 200 mg by mouth 3 (three) times daily as needed. (Patient not taking: Reported on 07/13/2022)     amoxicillin-clavulanate (AUGMENTIN) 875-125 MG tablet Take 1 tablet by mouth 2 (two) times daily.     fluconazole (DIFLUCAN) 150 MG tablet Take 1 tablet (150 mg total) by mouth once a week. 12 tablet 0   levothyroxine (SYNTHROID) 100 MCG tablet Take by mouth.     omeprazole (PRILOSEC) 40 MG capsule Take 1 capsule by mouth daily.     predniSONE (STERAPRED UNI-PAK 48 TAB) 10 MG (48) TBPK tablet Take by mouth daily.     No facility-administered medications prior to visit.     Review of Systems:   Constitutional:   No  weight loss, night sweats,  Fevers, chills,  +fatigue, or  lassitude.  HEENT:   No headaches,  Difficulty swallowing,  Tooth/dental problems, or  Sore throat,                No sneezing, itching, ear ache, nasal congestion, post nasal drip,   CV:  No chest pain,  Orthopnea, PND, swelling in lower extremities, anasarca, dizziness, palpitations, syncope.   GI  No heartburn, indigestion, abdominal pain, nausea, vomiting, diarrhea, change in bowel habits, loss of appetite, bloody stools.   Resp: .  No chest wall deformity  Skin: no  rash or lesions.  GU: no dysuria, change in color of urine, no urgency or frequency.  No flank pain, no hematuria   MS:  No joint pain or swelling.  No decreased range of motion.  No back pain.    Physical Exam  BP 110/78 (BP Location: Left Arm, Cuff Size: Normal)   Pulse 71   Ht '5\' 4"'$  (1.626 m)   Wt 177 lb 3.2 oz (80.4 kg)   LMP 06/04/2012   SpO2 97%   BMI 30.42 kg/m   GEN: A/Ox3; pleasant , NAD, well nourished    HEENT:  [place]/AT,   NOSE-clear, THROAT-clear, no lesions, no postnasal drip or exudate noted.   NECK:  Supple w/ fair ROM; no JVD; normal carotid impulses w/o bruits; no thyromegaly or nodules palpated; no lymphadenopathy.    RESP  Clear  P &  A; w/o, wheezes/ rales/ or rhonchi. no accessory muscle use, no dullness to percussion  CARD:  RRR, no m/r/g, no peripheral edema, pulses intact, no cyanosis or clubbing.  GI:   Soft & nt; nml bowel sounds; no organomegaly or masses detected.   Musco: Warm bil, no deformities or joint swelling noted.   Neuro: alert, no focal deficits noted.    Skin: Warm, no lesions or rashes    Lab Results:  CBC   BMET   BNP No results found for: "BNP"  ProBNP No results found for: "PROBNP"  Imaging: No results found.       Latest Ref Rng & Units 01/23/2018    9:53 AM  PFT Results  FVC-Pre L 3.37   FVC-Predicted Pre % 93   FVC-Post L 3.60   FVC-Predicted Post % 99   Pre FEV1/FVC % % 78   Post FEV1/FCV % % 82   FEV1-Pre L 2.64   FEV1-Predicted Pre % 91   FEV1-Post L 2.96   DLCO uncorrected ml/min/mmHg 20.01   DLCO UNC% % 80   DLVA Predicted % 76   TLC L 5.93   TLC % Predicted % 115   RV % Predicted % 149     Lab Results  Component Value Date   NITRICOXIDE 18 01/02/2018        Assessment & Plan:   Asthma Mild persistent asthma under excellent control.  Asthma action plan discussed.  Plan  Patient Instructions  Continue on Claritin and Singulair daily.  Continue on Symbicort 1-2  puffs Twice  daily , rinse after use.  Albuterol inhaler /neb As needed   Good luck with upcoming surgery  Activity as tolerated.  Continue on Lung cancer CT screening program.  Follow up with Dr. Chase Caller or Ed Mandich in 1 year and As needed   Please contact office for sooner follow up if symptoms do not improve or worsen or seek emergency care      Allergic rhinitis Continue on current regimen.  Appears to be under good control.  Plan Patient Instructions  Continue on Claritin and Singulair daily.  Continue on Symbicort 1-2  puffs Twice daily , rinse after use.  Albuterol inhaler /neb As needed   Good luck with upcoming surgery  Activity as tolerated.  Continue on Lung cancer CT screening program.  Follow up with Dr. Chase Caller or Burnette Valenti in 1 year and As needed   Please contact office for sooner follow up if symptoms do not improve or worsen or seek emergency care      Preop pulmonary/respiratory exam Pulmonary preop risk assessment.  Patient has upcoming left knee replacement surgery.  Patient is fully independent.  Currently asthma appears to be under good control.  Spirometry earlier this year showed stable lung function.  Patient is no longer smoking.  Her chronic allergies is also under good control.  She would be considered a mild preop risk.  Major Pulmonary risks identified in the multifactorial risk analysis are but not limited to a) pneumonia; b) recurrent intubation risk; c) prolonged or recurrent acute respiratory failure needing mechanical ventilation; d) prolonged hospitalization; e) DVT/Pulmonary embolism; f) Acute Pulmonary edema   Recommend 1. Short duration of surgery as much as possible and avoid paralytic if possible 2. Recovery in step down or ICU with Pulmonary consultation if indicated  3. DVT prophylaxis as indicated  4. Aggressive pulmonary toilet with o2, bronchodilatation, and incentive spirometry and early ambulation  Lung nodule Very small pulmonary nodules  scattered on CT scan appeared to be stable on most recent low-dose CT in January.  Patient is continue with yearly screening CT.     Rexene Edison, NP 07/13/2022

## 2022-07-13 NOTE — Assessment & Plan Note (Signed)
Pulmonary preop risk assessment.  Patient has upcoming left knee replacement surgery.  Patient is fully independent.  Currently asthma appears to be under good control.  Spirometry earlier this year showed stable lung function.  Patient is no longer smoking.  Her chronic allergies is also under good control.  She would be considered a mild preop risk.  Major Pulmonary risks identified in the multifactorial risk analysis are but not limited to a) pneumonia; b) recurrent intubation risk; c) prolonged or recurrent acute respiratory failure needing mechanical ventilation; d) prolonged hospitalization; e) DVT/Pulmonary embolism; f) Acute Pulmonary edema   Recommend 1. Short duration of surgery as much as possible and avoid paralytic if possible 2. Recovery in step down or ICU with Pulmonary consultation if indicated  3. DVT prophylaxis as indicated  4. Aggressive pulmonary toilet with o2, bronchodilatation, and incentive spirometry and early ambulation

## 2022-07-13 NOTE — Assessment & Plan Note (Signed)
Very small pulmonary nodules scattered on CT scan appeared to be stable on most recent low-dose CT in January.  Patient is continue with yearly screening CT.

## 2022-07-13 NOTE — Assessment & Plan Note (Signed)
Mild persistent asthma under excellent control.  Asthma action plan discussed.  Plan  Patient Instructions  Continue on Claritin and Singulair daily.  Continue on Symbicort 1-2  puffs Twice daily , rinse after use.  Albuterol inhaler /neb As needed   Good luck with upcoming surgery  Activity as tolerated.  Continue on Lung cancer CT screening program.  Follow up with Dr. Chase Caller or Creed Kail in 1 year and As needed   Please contact office for sooner follow up if symptoms do not improve or worsen or seek emergency care

## 2022-07-13 NOTE — Assessment & Plan Note (Signed)
Continue on current regimen.  Appears to be under good control.  Plan Patient Instructions  Continue on Claritin and Singulair daily.  Continue on Symbicort 1-2  puffs Twice daily , rinse after use.  Albuterol inhaler /neb As needed   Good luck with upcoming surgery  Activity as tolerated.  Continue on Lung cancer CT screening program.  Follow up with Dr. Chase Caller or Paulette Rockford in 1 year and As needed   Please contact office for sooner follow up if symptoms do not improve or worsen or seek emergency care

## 2022-07-13 NOTE — Telephone Encounter (Signed)
Please see my note, it is addressed

## 2022-07-14 NOTE — Telephone Encounter (Signed)
OV notes and clearance form have been faxed back to [facility]. Nothing further needed at this time.  °

## 2022-09-06 ENCOUNTER — Ambulatory Visit
Admission: RE | Admit: 2022-09-06 | Discharge: 2022-09-06 | Disposition: A | Discharge status: Home / Self Care | Payer: 59 | Source: Ambulatory Visit | Attending: Acute Care | Admitting: Acute Care

## 2022-09-06 DIAGNOSIS — Z87891 Personal history of nicotine dependence: Secondary | ICD-10-CM

## 2022-09-08 ENCOUNTER — Other Ambulatory Visit: Payer: Self-pay | Admitting: Acute Care

## 2022-09-08 DIAGNOSIS — Z122 Encounter for screening for malignant neoplasm of respiratory organs: Secondary | ICD-10-CM

## 2022-09-08 DIAGNOSIS — Z87891 Personal history of nicotine dependence: Secondary | ICD-10-CM

## 2022-11-02 ENCOUNTER — Ambulatory Visit: Payer: 59 | Admitting: Adult Health

## 2023-03-15 ENCOUNTER — Encounter: Payer: Self-pay | Admitting: Podiatry

## 2023-03-15 ENCOUNTER — Ambulatory Visit (INDEPENDENT_AMBULATORY_CARE_PROVIDER_SITE_OTHER): Payer: 59 | Admitting: Podiatry

## 2023-03-15 DIAGNOSIS — M7661 Achilles tendinitis, right leg: Secondary | ICD-10-CM

## 2023-03-15 DIAGNOSIS — L600 Ingrowing nail: Secondary | ICD-10-CM | POA: Diagnosis not present

## 2023-03-15 MED ORDER — TRIAMCINOLONE ACETONIDE 10 MG/ML IJ SUSP
10.0000 mg | Freq: Once | INTRAMUSCULAR | Status: AC
  Administered 2023-03-15: 10 mg via INTRA_ARTICULAR

## 2023-03-15 NOTE — Progress Notes (Signed)
Subjective:   Patient ID: Mikayla Richardson, female   DOB: 52 y.o.   MRN: [id number]   HPI Patient presents stating she needs an ingrown toenail removed on her left big toe   ROS      Objective:  Physical Exam  Neurovascular status intact incurvated medial border left hallux painful when pressed no redness drainage noted     Assessment:  Ingrown toenail deformity left hallux medial border painful     Plan:  H&P and we reviewed.  I have recommended removal of the nail border explained procedure risk patient wants surgery understanding risk today I allowed her to sign consent form.  I infiltrated the left big toe 60 mg like Marcaine mixture sterile prep done using sterile instrumentation remove the border exposed matrix applied phenol 3 applications 30 seconds followed by alcohol of sterile dressing gave instructions on soaks and to wear dressing 24 hours to get up earlier if throbbing were to occur encouraged to call questions concerns which may arise

## 2023-03-15 NOTE — Patient Instructions (Addendum)
Betadine Soak Instructions  Purchase an 8 oz. bottle of BETADINE solution (Povidone)  THE DAY AFTER THE PROCEDURE  Place 1 tablespoon of betadine solution in a quart of warm tap water.  Submerge your foot or feet with outer bandage intact for the initial soak; this will allow the bandage to become moist and wet for easy lift off.  Once you remove your bandage, continue to soak in the solution for 20 minutes.  This soak should be done twice a day.  Next, remove your foot or feet from solution, blot dry the affected area and cover.  You may use a band aid large enough to cover the area or use gauze and tape.  Apply other medications to the area as directed by the doctor such as cortisporin otic solution (ear drops) or neosporin.  IF YOUR SKIN BECOMES IRRITATED WHILE USING THESE INSTRUCTIONS, IT IS OKAY TO SWITCH TO EPSOM SALTS AND WATER OR WHITE VINEGAR AND WATER.   Achilles Tendinitis  with Rehab Achilles tendinitis is a disorder of the Achilles tendon. The Achilles tendon connects the large calf muscles (Gastrocnemius and Soleus) to the heel bone (calcaneus). This tendon is sometimes called the heel cord. It is important for pushing-off and standing on your toes and is important for walking, running, or jumping. Tendinitis is often caused by overuse and repetitive microtrauma. SYMPTOMS  Pain, tenderness, swelling, warmth, and redness may occur over the Achilles tendon even at rest.  Pain with pushing off, or flexing or extending the ankle.  Pain that is worsened after or during activity. CAUSES   Overuse sometimes seen with rapid increase in exercise programs or in sports requiring running and jumping.  Poor physical conditioning (strength and flexibility or endurance).  Running sports, especially training running down hills.  Inadequate warm-up before practice or play or failure to stretch before participation.  Injury to the tendon. PREVENTION   Warm up and stretch before practice  or competition.  Allow time for adequate rest and recovery between practices and competition.  Keep up conditioning.  Keep up ankle and leg flexibility.  Improve or keep muscle strength and endurance.  Improve cardiovascular fitness.  Use proper technique.  Use proper equipment (shoes, skates).  To help prevent recurrence, taping, protective strapping, or an adhesive bandage may be recommended for several weeks after healing is complete. PROGNOSIS   Recovery may take weeks to several months to heal.  Longer recovery is expected if symptoms have been prolonged.  Recovery is usually quicker if the inflammation is due to a direct blow as compared with overuse or sudden strain. RELATED COMPLICATIONS   Healing time will be prolonged if the condition is not correctly treated. The injury must be given plenty of time to heal.  Symptoms can reoccur if activity is resumed too soon.  Untreated, tendinitis may increase the risk of tendon rupture requiring additional time for recovery and possibly surgery. TREATMENT   The first treatment consists of rest anti-inflammatory medication, and ice to relieve the pain.  Stretching and strengthening exercises after resolution of pain will likely help reduce the risk of recurrence. Referral to a physical therapist or athletic trainer for further evaluation and treatment may be helpful.  A walking boot or cast may be recommended to rest the Achilles tendon. This can help break the cycle of inflammation and microtrauma.  Arch supports (orthotics) may be prescribed or recommended by your caregiver as an adjunct to therapy and rest.  Surgery to remove the inflamed tendon lining  or degenerated tendon tissue is rarely necessary and has shown less than predictable results. MEDICATION   Nonsteroidal anti-inflammatory medications, such as aspirin and ibuprofen, may be used for pain and inflammation relief. Do not take within 7 days before surgery. Take  these as directed by your caregiver. Contact your caregiver immediately if any bleeding, stomach upset, or signs of allergic reaction occur. Other minor pain relievers, such as acetaminophen, may also be used.  Pain relievers may be prescribed as necessary by your caregiver. Do not take prescription pain medication for longer than 4 to 7 days. Use only as directed and only as much as you need.  Cortisone injections are rarely indicated. Cortisone injections may weaken tendons and predispose to rupture. It is better to give the condition more time to heal than to use them. HEAT AND COLD  Cold is used to relieve pain and reduce inflammation for acute and chronic Achilles tendinitis. Cold should be applied for 10 to 15 minutes every 2 to 3 hours for inflammation and pain and immediately after any activity that aggravates your symptoms. Use ice packs or an ice massage.  Heat may be used before performing stretching and strengthening activities prescribed by your caregiver. Use a heat pack or a warm soak. SEEK MEDICAL CARE IF:  Symptoms get worse or do not improve in 2 weeks despite treatment.  New, unexplained symptoms develop. Drugs used in treatment may produce side effects.  EXERCISES:  RANGE OF MOTION (ROM) AND STRETCHING EXERCISES - Achilles Tendinitis  These exercises may help you when beginning to rehabilitate your injury. Your symptoms may resolve with or without further involvement from your physician, physical therapist or athletic trainer. While completing these exercises, remember:   Restoring tissue flexibility helps normal motion to return to the joints. This allows healthier, less painful movement and activity.  An effective stretch should be held for at least 30 seconds.  A stretch should never be painful. You should only feel a gentle lengthening or release in the stretched tissue.  STRETCH  Gastroc, Standing   Place hands on wall.  Extend right / left leg, keeping the  front knee somewhat bent.  Slightly point your toes inward on your back foot.  Keeping your right / left heel on the floor and your knee straight, shift your weight toward the wall, not allowing your back to arch.  You should feel a gentle stretch in the right / left calf. Hold this position for 10 seconds. Repeat 3 times. Complete this stretch 2 times per day.  STRETCH  Soleus, Standing   Place hands on wall.  Extend right / left leg, keeping the other knee somewhat bent.  Slightly point your toes inward on your back foot.  Keep your right / left heel on the floor, bend your back knee, and slightly shift your weight over the back leg so that you feel a gentle stretch deep in your back calf.  Hold this position for 10 seconds. Repeat 3 times. Complete this stretch 2 times per day.  STRETCH  Gastrocsoleus, Standing  Note: This exercise can place a lot of stress on your foot and ankle. Please complete this exercise only if specifically instructed by your caregiver.   Place the ball of your right / left foot on a step, keeping your other foot firmly on the same step.  Hold on to the wall or a rail for balance.  Slowly lift your other foot, allowing your body weight to press your heel down  over the edge of the step.  You should feel a stretch in your right / left calf.  Hold this position for 10 seconds.  Repeat this exercise with a slight bend in your knee. Repeat 3 times. Complete this stretch 2 times per day.   STRENGTHENING EXERCISES - Achilles Tendinitis These exercises may help you when beginning to rehabilitate your injury. They may resolve your symptoms with or without further involvement from your physician, physical therapist or athletic trainer. While completing these exercises, remember:   Muscles can gain both the endurance and the strength needed for everyday activities through controlled exercises.  Complete these exercises as instructed by your physician,  physical therapist or athletic trainer. Progress the resistance and repetitions only as guided.  You may experience muscle soreness or fatigue, but the pain or discomfort you are trying to eliminate should never worsen during these exercises. If this pain does worsen, stop and make certain you are following the directions exactly. If the pain is still present after adjustments, discontinue the exercise until you can discuss the trouble with your clinician.  STRENGTH - Plantar-flexors   Sit with your right / left leg extended. Holding onto both ends of a rubber exercise band/tubing, loop it around the ball of your foot. Keep a slight tension in the band.  Slowly push your toes away from you, pointing them downward.  Hold this position for 10 seconds. Return slowly, controlling the tension in the band/tubing. Repeat 3 times. Complete this exercise 2 times per day.   STRENGTH - Plantar-flexors   Stand with your feet shoulder width apart. Steady yourself with a wall or table using as little support as needed.  Keeping your weight evenly spread over the width of your feet, rise up on your toes.*  Hold this position for 10 seconds. Repeat 3 times. Complete this exercise 2 times per day.  *If this is too easy, shift your weight toward your right / left leg until you feel challenged. Ultimately, you may be asked to do this exercise with your right / left foot only.  STRENGTH  Plantar-flexors, Eccentric  Note: This exercise can place a lot of stress on your foot and ankle. Please complete this exercise only if specifically instructed by your caregiver.   Place the balls of your feet on a step. With your hands, use only enough support from a wall or rail to keep your balance.  Keep your knees straight and rise up on your toes.  Slowly shift your weight entirely to your right / left toes and pick up your opposite foot. Gently and with controlled movement, lower your weight through your right /  left foot so that your heel drops below the level of the step. You will feel a slight stretch in the back of your calf at the end position.  Use the healthy leg to help rise up onto the balls of both feet, then lower weight only on the right / left leg again. Build up to 15 repetitions. Then progress to 3 consecutive sets of 15 repetitions.*  After completing the above exercise, complete the same exercise with a slight knee bend (about 30 degrees). Again, build up to 15 repetitions. Then progress to 3 consecutive sets of 15 repetitions.* Perform this exercise 2 times per day.  *When you easily complete 3 sets of 15, your physician, physical therapist or athletic trainer may advise you to add resistance by wearing a backpack filled with additional weight.  STRENGTH - Plantar  Flexors, Seated   Sit on a chair that allows your feet to rest flat on the ground. If necessary, sit at the edge of the chair.  Keeping your toes firmly on the ground, lift your right / left heel as far as you can without increasing any discomfort in your ankle. Repeat 3 times. Complete this exercise 2 times a day.

## 2023-06-06 ENCOUNTER — Ambulatory Visit (INDEPENDENT_AMBULATORY_CARE_PROVIDER_SITE_OTHER): Payer: 59 | Admitting: Gastroenterology

## 2023-06-06 ENCOUNTER — Encounter: Payer: Self-pay | Admitting: Gastroenterology

## 2023-06-06 VITALS — BP 100/68 | HR 67 | Ht 64.0 in | Wt 157.0 lb

## 2023-06-06 DIAGNOSIS — K219 Gastro-esophageal reflux disease without esophagitis: Secondary | ICD-10-CM

## 2023-06-06 DIAGNOSIS — Z860100 Personal history of colon polyps, unspecified: Secondary | ICD-10-CM | POA: Diagnosis not present

## 2023-06-06 MED ORDER — OMEPRAZOLE 40 MG PO CPDR: 40.0000 mg | DELAYED_RELEASE_CAPSULE | Freq: Two times a day (BID) | ORAL | 0 refills | Status: DC

## 2023-06-06 NOTE — Patient Instructions (Signed)
Increase your omeprazole 40 mg to twice daily. A new prescription has been sent to your pharmacy.   Call our office or MyChart message [place] if your symptoms have not improved.   The [facility] providers would like to encourage you to use [facility] to communicate with providers for non-urgent requests or questions.  Due to long hold times on the telephone, sending your provider a message by [facility] may be a faster and more efficient way to get a response.  Please allow 48 business hours for a response.  Please remember that this is for non-urgent requests.   Thank you for choosing me and  Gastroenterology.  Venita Lick. Pleas Koch., MD., [facility]

## 2023-06-06 NOTE — Progress Notes (Signed)
Assessment     GERD, not adequately controlled  Family history of colon cancer, personal history of sessile serrated colon polyps    Recommendations    Increase omeprazole 40 mg po bid, Tums or Pepcid AC as needed, closely follow antireflux measures. Contact us in 2-3 weeks if symptoms are not well controlled to consider medication change and/or EGD. Surveillance colonoscopy recommended in Feb 2026   HPI    This is a 52 year old female who with worsening reflux symptoms.  She states she has had worsening difficulties with heartburn, hiccuping and belching.  She notes her symptoms are more active in the afternoons and evenings.  Her omeprazole dosing was reduced from twice daily to daily by her PCP and this correlates with her worsening symptoms.  No dysphagia, odynophagia, weight loss, chest pain, abdominal pain, nausea or vomiting.   Labs / Imaging       Latest Ref Rng & Units 06/07/2021    2:00 PM 03/07/2021   11:22 AM  Hepatic Function  Total Protein 6.1 - 8.1 g/dL 6.6  6.7   AST 10 - 35 U/L 19  17   ALT 6 - 29 U/L 9  6   Total Bilirubin 0.2 - 1.2 mg/dL 0.3  0.4   Bilirubin, Direct 0.0 - 0.2 mg/dL 0.1  0.1        Latest Ref Rng & Units 06/07/2021    2:00 PM 03/07/2021   11:22 AM 08/04/2012    8:13 PM  CBC  WBC 3.8 - 10.8 Thousand/uL 6.3  5.4  8.1   Hemoglobin 11.7 - 15.5 g/dL 06.3  01.6  01.0   Hematocrit 35.0 - 45.0 % 40.8  41.3  38.9   Platelets 140 - 400 Thousand/uL 233  254  288      CT CHEST LUNG CA SCREEN LOW DOSE W/O CM CLINICAL DATA:  Former smoker. Fifty-two pack-year smoking history. Asymptomatic.  EXAM: CT CHEST WITHOUT CONTRAST LOW-DOSE FOR LUNG CANCER SCREENING  TECHNIQUE: Multidetector CT imaging of the chest was performed following the standard protocol without IV contrast.  RADIATION DOSE REDUCTION: This exam was performed according to the departmental dose-optimization program which includes automated exposure control, adjustment of the mA  and/or kV according to patient size and/or use of iterative reconstruction technique.  COMPARISON:  09/06/2021  FINDINGS: Cardiovascular: Heart size is normal. Aortic atherosclerosis. Coronary artery calcifications noted.  Mediastinum/Nodes: No enlarged mediastinal, hilar, or axillary lymph nodes. Thyroid gland, trachea, and esophagus demonstrate no significant findings.  Lungs/Pleura: No pleural fluid or airspace disease. Scarring noted within the lung bases. Mild centrilobular emphysema. Calcified granulomas are again noted. Several small solid lung nodules are identified the largest nodule is in the apical segment of the left upper lobe with a mean derived diameter of 3.3 mm. These are unchanged from prior exam.  Upper Abdomen: No acute abnormality.  Status post cholecystectomy.  Musculoskeletal: No chest wall mass or suspicious bone lesions identified.  IMPRESSION: 1. Lung-RADS 2, benign appearance or behavior. Continue annual screening with low-dose chest CT without contrast in 12 months. 2. Coronary artery calcifications. 3.  Aortic Atherosclerosis (ICD10-I70.0).  Electronically Signed   By: Signa Kell M.D.   On: 09/07/2022 15:37   Current Medications, Allergies, Past Medical History, Past Surgical History, Family History and Social History were reviewed in [employer] record.   Physical Exam: General: Well developed, well nourished, no acute distress Head: Normocephalic and atraumatic Eyes: Sclerae anicteric, EOMI Ears: Normal  auditory acuity Mouth: No deformities or lesions noted Lungs: Clear throughout to auscultation Heart: Regular rate and rhythm; No murmurs, rubs or bruits Abdomen: Soft, non tender and non distended. No masses, hepatosplenomegaly or hernias noted. Normal Bowel sounds Rectal: Not done Musculoskeletal: Symmetrical with no gross deformities  Pulses:  Normal pulses noted Extremities: No edema or deformities  noted Neurological: Alert oriented x 4, grossly nonfocal Psychological:  Alert and cooperative. Normal mood and affect   Aarvi Stotts T. Russella Dar, MD 06/06/2023, 9:06 AM

## 2023-06-15 ENCOUNTER — Ambulatory Visit (INDEPENDENT_AMBULATORY_CARE_PROVIDER_SITE_OTHER): Payer: 59 | Admitting: Podiatry

## 2023-06-15 ENCOUNTER — Encounter: Payer: Self-pay | Admitting: Podiatry

## 2023-06-15 DIAGNOSIS — L600 Ingrowing nail: Secondary | ICD-10-CM

## 2023-06-15 NOTE — Patient Instructions (Addendum)

## 2023-06-17 NOTE — Progress Notes (Signed)
Subjective:   Patient ID: Mikayla Richardson, female   DOB: 52 y.o.   MRN: [id number]   HPI Patient has developed a new ingrown toenail on the right big toe lateral border.  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  I infiltrated the right big toe 60 mg Xylocaine Marcaine mixture sterile prep done using sterile instrumentation remove the border exposed matrix applied phenol 3 applications 30 seconds followed by alcohol lavage sterile dressing gave instructions on soaks and wearing dressing for 24 hours taken off earlier if throbbing were to occur.  All questions answered and patient encouraged to call questions concerned

## 2023-07-13 ENCOUNTER — Ambulatory Visit: Payer: 59 | Admitting: Adult Health

## 2023-07-13 ENCOUNTER — Encounter: Payer: Self-pay | Admitting: Adult Health

## 2023-07-13 VITALS — BP 104/70 | HR 80 | Temp 97.7°F

## 2023-07-13 DIAGNOSIS — J301 Allergic rhinitis due to pollen: Secondary | ICD-10-CM

## 2023-07-13 DIAGNOSIS — J302 Other seasonal allergic rhinitis: Secondary | ICD-10-CM

## 2023-07-13 DIAGNOSIS — J4530 Mild persistent asthma, uncomplicated: Secondary | ICD-10-CM

## 2023-07-13 DIAGNOSIS — R911 Solitary pulmonary nodule: Secondary | ICD-10-CM

## 2023-07-13 NOTE — Progress Notes (Signed)
@Patient  ID: Mikayla Richardson, female    DOB: 1970-12-13, 52 y.o.   MRN: [id number]  Chief Complaint  Patient presents with   Follow-up    Referring provider: No ref. provider found  HPI: 52 year old female former smoker followed for asthma, emphysema, chronic cough and lung nodule Participates in the lung cancer screening program  TEST/EVENTS :  Pulmonary function testing done June 2019 showed normal lung function with an FEV1 at 103%, ratio 82, FVC 99%, positive bronchodilator response.,  DLCO 80%.   High-resolution CT chest January 11, 2018 showed no evidence of interstitial lung disease.  Solid 3 mm apical right upper lobe nodule.   CT chest March 05, 2020 showed biapical pleural-parenchymal scarring, mild emphysema, noncalcified pulmonary nodules measuring 4 mm or less.  Unchanged and consistent with benign etiology.   Lung cancer screening CT chest September 06, 2021 lung RADS 2 with benign appearance, diffuse bronchial wall thickening with emphysema, several small pulmonary nodules identified largest measuring 3.5 mm  Lung cancer screening CT chest September 06, 2022 showed a lung RADS 2 benign appearance.  Mild emphysema.  Several small solid lung nodules largest measuring 3.3 mm.  Unchanged from previous exam.  Spirometry 10/2021 -Normal   07/13/2023 Follow up : Asthma , Emphysema, Lung nodules  Patient returns for 1 year follow-up.  She has mild persistent asthma.  Says overall she is doing very well.  She remains on Symbicort.  Says a lot of days she does not even need it.  She remains on Claritin and Singulair daily.  Says that her breathing has been doing well.  Denies any flare of cough or wheezing.  Says she has become very active over this last year since she got her knee replacement.  She is lost 30 pounds and is exercising hour walking daily.  Feels well.  She participates in the lung cancer CT screening program.  CT chest January 2024 showed lung RADS 2 with no suspicious lung  nodules.  There was stable mild emphysema.  Stable small pulmonary nodules measuring max at 3.3 mm.   COVID and flu vaccines are up-to-date.  She is going out of town tomorrow she is going to [place] with her sister.  Is very excited about her upcoming travel   No Known Allergies  Immunization History  Administered Date(s) Administered   Influenza Split 05/24/2012, 05/07/2013, 06/19/2017   Influenza,inj,Quad PF,6+ Mos 05/14/2018, 08/13/2019, 05/24/2022   Influenza-Unspecified 05/24/2012, 05/07/2013, 06/19/2017   Moderna Covid-19 Fall Seasonal Vaccine 49yrs & older 05/24/2022, 06/04/2023   PFIZER(Purple Top)SARS-COV-2 Vaccination 11/02/2019, 11/23/2019, 05/26/2020   Pfizer Covid-19 Vaccine Bivalent Booster 54yrs & up 08/24/2021   Pneumococcal Polysaccharide-23 01/08/2013   Tdap 08/29/2019, 08/17/2021    Past Medical History:  Diagnosis Date   Allergy    Anxiety    Arthritis    knee   Asthma    Blood transfusion without reported diagnosis    1991 after childbirth   Colon polyps    GERD (gastroesophageal reflux disease)    Hyperlipidemia    Thyroid disease    UTI (urinary tract infection)     Tobacco History: Social History   Tobacco Use  Smoking Status Former   Current packs/day: 0.00   Average packs/day: 1 pack/day for 15.0 years (15.0 ttl pk-yrs)   Types: Cigarettes   Start date: 06/16/2005   Quit date: 06/16/2020   Years since quitting: 3.0  Smokeless Tobacco Never  Tobacco Comments   Quit smoking as of 06/2020   05/02/2021  TH   Counseling given: Not Answered Tobacco comments: Quit smoking as of 06/2020 05/02/2021 TH   Outpatient Medications Prior to Visit  Medication Sig Dispense Refill   albuterol (PROVENTIL) (2.5 MG/3ML) 0.083% nebulizer solution Take 3 mLs (2.5 mg total) by nebulization every 6 (six) hours as needed for wheezing or shortness of breath. 75 mL 3   albuterol (VENTOLIN HFA) 108 (90 Base) MCG/ACT inhaler Inhale 1-2 puffs into the lungs every  4 (four) hours as needed for wheezing or shortness of breath.     budesonide-formoterol (SYMBICORT) 80-4.5 MCG/ACT inhaler Inhale 2 puffs into the lungs 2 (two) times daily. 10.2 g 6   cetirizine (ZYRTEC) 10 MG tablet Take 1 tablet by mouth daily.     fluticasone (FLONASE) 50 MCG/ACT nasal spray Place into both nostrils daily.     levothyroxine (SYNTHROID) 100 MCG tablet Take by mouth daily before breakfast.     montelukast (SINGULAIR) 10 MG tablet Take 10 mg by mouth at bedtime.     omeprazole (PRILOSEC) 40 MG capsule Take 1 capsule (40 mg total) by mouth 2 (two) times daily. 180 capsule 0   rosuvastatin (CRESTOR) 20 MG tablet Take 1 tablet by mouth daily.     No facility-administered medications prior to visit.     Review of Systems:   Constitutional:   No  weight loss, night sweats,  Fevers, chills, fatigue, or  lassitude.  HEENT:   No headaches,  Difficulty swallowing,  Tooth/dental problems, or  Sore throat,                No sneezing, itching, ear ache, nasal congestion, post nasal drip,   CV:  No chest pain,  Orthopnea, PND, swelling in lower extremities, anasarca, dizziness, palpitations, syncope.   GI  No heartburn, indigestion, abdominal pain, nausea, vomiting, diarrhea, change in bowel habits, loss of appetite, bloody stools.   Resp: No shortness of breath with exertion or at rest.  No excess mucus, no productive cough,  No non-productive cough,  No coughing up of blood.  No change in color of mucus.  No wheezing.  No chest wall deformity  Skin: no rash or lesions.  GU: no dysuria, change in color of urine, no urgency or frequency.  No flank pain, no hematuria   MS:  No joint pain or swelling.  No decreased range of motion.  No back pain.    Physical Exam  BP 104/70 (BP Location: Left Arm, Patient Position: Sitting, Cuff Size: Normal)   Pulse 80   Temp 97.7 F (36.5 C) (Oral)   LMP 06/04/2012   SpO2 98%   GEN: A/Ox3; pleasant , NAD, well nourished    HEENT:   [place]/AT,  NOSE-clear, THROAT-clear, no lesions, no postnasal drip or exudate noted.   NECK:  Supple w/ fair ROM; no JVD; normal carotid impulses w/o bruits; no thyromegaly or nodules palpated; no lymphadenopathy.    RESP  Clear  P & A; w/o, wheezes/ rales/ or rhonchi. no accessory muscle use, no dullness to percussion  CARD:  RRR, no m/r/g, no peripheral edema, pulses intact, no cyanosis or clubbing.  GI:   Soft & nt; nml bowel sounds; no organomegaly or masses detected.   Musco: Warm bil, no deformities or joint swelling noted.   Neuro: alert, no focal deficits noted.    Skin: Warm, no lesions or rashes    Lab Results:  CBC  No results found for: "BNP"  ProBNP No results found for: "PROBNP"  Imaging:  No results found.  Administration History     None          Latest Ref Rng & Units 01/23/2018    9:53 AM  PFT Results  FVC-Pre L 3.37   FVC-Predicted Pre % 93   FVC-Post L 3.60   FVC-Predicted Post % 99   Pre FEV1/FVC % % 78   Post FEV1/FCV % % 82   FEV1-Pre L 2.64   FEV1-Predicted Pre % 91   FEV1-Post L 2.96   DLCO uncorrected ml/min/mmHg 20.01   DLCO UNC% % 80   DLVA Predicted % 76   TLC L 5.93   TLC % Predicted % 115   RV % Predicted % 149     Lab Results  Component Value Date   NITRICOXIDE 18 01/02/2018        Assessment & Plan:   Asthma Appears well-controlled.  Discussed asthma action plan.  Plan  Patient Instructions  Continue on Claritin and Singulair daily.  Continue on Symbicort 1-2  puffs Twice daily , rinse after use.  Albuterol inhaler /neb As needed   Activity as tolerated.  Continue on Lung cancer CT screening program.  Follow up with Dr. Marchelle Gearing or Trevyn Lumpkin in 1 year and As needed   Please contact office for sooner follow up if symptoms do not improve or worsen or seek emergency care    Emphysema -stable on CT Chest .   Allergic rhinitis Controlled on current regimen  Lung nodule Stable on CT chest  Seasonal allergic  rhinitis due to pollen Controlled      Rubye Oaks, NP 07/13/2023

## 2023-07-13 NOTE — Patient Instructions (Signed)
Continue on Claritin and Singulair daily.  Continue on Symbicort 1-2  puffs Twice daily , rinse after use.  Albuterol inhaler /neb As needed   Activity as tolerated.  Continue on Lung cancer CT screening program.  Follow up with Dr. Marchelle Gearing or Aadam Zhen in 1 year and As needed   Please contact office for sooner follow up if symptoms do not improve or worsen or seek emergency care

## 2023-07-13 NOTE — Assessment & Plan Note (Signed)
Controlled on current regimen.   

## 2023-07-13 NOTE — Assessment & Plan Note (Signed)
Controlled.  

## 2023-07-13 NOTE — Assessment & Plan Note (Signed)
Stable on CT chest

## 2023-07-13 NOTE — Assessment & Plan Note (Signed)
Appears well-controlled.  Discussed asthma action plan.  Plan  Patient Instructions  Continue on Claritin and Singulair daily.  Continue on Symbicort 1-2  puffs Twice daily , rinse after use.  Albuterol inhaler /neb As needed   Activity as tolerated.  Continue on Lung cancer CT screening program.  Follow up with Dr. Marchelle Gearing or Paisyn Guercio in 1 year and As needed   Please contact office for sooner follow up if symptoms do not improve or worsen or seek emergency care

## 2023-07-16 ENCOUNTER — Other Ambulatory Visit: Payer: Self-pay | Admitting: *Deleted

## 2023-07-16 MED ORDER — ALBUTEROL SULFATE HFA 108 (90 BASE) MCG/ACT IN AERS: 2.0000 | INHALATION_SPRAY | Freq: Four times a day (QID) | RESPIRATORY_TRACT | 1 refills | Status: DC | PRN

## 2023-07-16 MED ORDER — BUDESONIDE-FORMOTEROL FUMARATE 80-4.5 MCG/ACT IN AERO: 1.0000 | INHALATION_SPRAY | Freq: Two times a day (BID) | RESPIRATORY_TRACT | 4 refills | Status: AC

## 2023-07-16 MED ORDER — ALBUTEROL SULFATE HFA 108 (90 BASE) MCG/ACT IN AERS: 2.0000 | INHALATION_SPRAY | Freq: Four times a day (QID) | RESPIRATORY_TRACT | 2 refills | Status: DC | PRN

## 2023-07-16 MED ORDER — BUDESONIDE-FORMOTEROL FUMARATE 80-4.5 MCG/ACT IN AERO: INHALATION_SPRAY | RESPIRATORY_TRACT | 11 refills | Status: DC

## 2023-08-03 ENCOUNTER — Other Ambulatory Visit: Payer: Self-pay | Admitting: Acute Care

## 2023-08-03 DIAGNOSIS — Z87891 Personal history of nicotine dependence: Secondary | ICD-10-CM

## 2023-08-03 DIAGNOSIS — Z122 Encounter for screening for malignant neoplasm of respiratory organs: Secondary | ICD-10-CM

## 2023-08-20 ENCOUNTER — Telehealth: Payer: Self-pay | Admitting: *Deleted

## 2023-08-20 MED ORDER — ALBUTEROL SULFATE HFA 108 (90 BASE) MCG/ACT IN AERS: 2.0000 | INHALATION_SPRAY | Freq: Four times a day (QID) | RESPIRATORY_TRACT | 6 refills | Status: AC | PRN

## 2023-08-20 NOTE — Telephone Encounter (Signed)
 Received fax from [employer] stating that the Albuterol  (Ventolin  HFA) 108 (90 base) mcg/act inhaler is not covered on the formulary and requested a change to Proair  HFA or Proventil  HFA.  New order sent in to fill insurance preference.  Nothing further needed.

## 2023-09-12 ENCOUNTER — Ambulatory Visit
Admission: RE | Admit: 2023-09-12 | Discharge: 2023-09-12 | Disposition: A | Discharge status: Home / Self Care | Payer: 59 | Source: Ambulatory Visit | Attending: Acute Care | Admitting: Acute Care

## 2023-09-12 DIAGNOSIS — Z87891 Personal history of nicotine dependence: Secondary | ICD-10-CM

## 2023-09-12 DIAGNOSIS — Z122 Encounter for screening for malignant neoplasm of respiratory organs: Secondary | ICD-10-CM

## 2023-09-17 ENCOUNTER — Telehealth: Payer: Self-pay | Admitting: Gastroenterology

## 2023-09-17 NOTE — Telephone Encounter (Signed)
 Patient requesting a refill for Omeprazole .

## 2023-09-17 NOTE — Telephone Encounter (Signed)
 Previous patient of Dr. Emaline Handsome. Patient is requesting a refill of omeprazole . Dr. Venice Gillis, you are DOD this afternoon. Can I refill medication?

## 2023-09-17 NOTE — Telephone Encounter (Signed)
 Please do Omeprazole  as before Arrange for follow-up-APP clinic in about 12 weeks RG

## 2023-09-18 MED ORDER — OMEPRAZOLE 40 MG PO CPDR: 40.0000 mg | DELAYED_RELEASE_CAPSULE | Freq: Two times a day (BID) | ORAL | 0 refills | Status: DC

## 2023-09-18 NOTE — Telephone Encounter (Signed)
Prescription sent to patient's pharmacy. Patient scheduled appt with Colleen on 12/03/23 at 10:00am.

## 2023-09-24 ENCOUNTER — Other Ambulatory Visit: Payer: Self-pay

## 2023-09-24 DIAGNOSIS — Z87891 Personal history of nicotine dependence: Secondary | ICD-10-CM

## 2023-09-24 DIAGNOSIS — Z122 Encounter for screening for malignant neoplasm of respiratory organs: Secondary | ICD-10-CM

## 2023-12-03 ENCOUNTER — Ambulatory Visit: Payer: 59 | Admitting: Nurse Practitioner

## 2023-12-03 ENCOUNTER — Encounter: Payer: Self-pay | Admitting: Nurse Practitioner

## 2023-12-03 VITALS — BP 94/60 | HR 76 | Ht 63.0 in | Wt 156.1 lb

## 2023-12-03 DIAGNOSIS — Z860100 Personal history of colon polyps, unspecified: Secondary | ICD-10-CM

## 2023-12-03 DIAGNOSIS — K219 Gastro-esophageal reflux disease without esophagitis: Secondary | ICD-10-CM | POA: Diagnosis not present

## 2023-12-03 DIAGNOSIS — Z860101 Personal history of adenomatous and serrated colon polyps: Secondary | ICD-10-CM

## 2023-12-03 MED ORDER — OMEPRAZOLE 40 MG PO CPDR: 40.0000 mg | DELAYED_RELEASE_CAPSULE | Freq: Two times a day (BID) | ORAL | 3 refills | Status: AC

## 2023-12-03 NOTE — Progress Notes (Signed)
 12/03/2023 Mikayla Richardson [id number] [birth year]   Chief Complaint: GERD follow up  History of Present Illness: Mikayla Richardson is a 53 year old female with a past medical history of anxiety, arthritis, hyperlipidemia, asthma, emphysema, lung nodule, hypothyroidism, GERD and colon polyps. Previously known by Dr. Sandrea Cruel.  She presents today for GERD follow-up and to renew her Omeprazole  prescription.  Her reflux symptoms are well-controlled as long as she continues Omeprazole  40 mg twice daily.  In November 2024, she took Omeprazole  once daily instead of bid due to a prescription error and within 4 weeks she had significant acid reflux.  She resumed Omeprazole  40 mg twice daily and her reflux symptoms were under control within a few weeks.  No dysphagia.  No upper or lower abdominal pain.  No bloody or black stools.  She drinks six 10 ounce cups of coffee daily and drinks more than 64 ounces of water  daily.  She exercises daily.  She underwent an EGD 12/20/2017 which showed a normal esophagus which was empirically dilated due to having dysphagia symptoms and mild erythematous mucosa was noted in the stomach, biopsies were negative for H. pylori.  Her most recent colonoscopy was 09/07/2021 and 5 sessile serrated polyps were removed from the sigmoid and transverse colon.  She was advised to repeat a colonoscopy in 3 years.  Prior colonoscopy 01/03/2020 identified 13 sessile serrated polyps removed from the colon.  She stated her mother passed away unexpectedly 08/22/2023 with stomach and colon cancer, primary cancer unknown.  She wishes to undergo an EGD at the time of her colonoscopy which is due 09/2024.     Latest Ref Rng & Units 06/07/2021    2:00 PM 03/07/2021   11:22 AM 08/04/2012    8:13 PM  CBC  WBC 3.8 - 10.8 Thousand/uL 6.3  5.4  8.1   Hemoglobin 11.7 - 15.5 g/dL 40.9  81.1  91.4   Hematocrit 35.0 - 45.0 % 40.8  41.3  38.9   Platelets 140 - 400 Thousand/uL 233  254  288        Latest Ref Rng & Units  06/07/2021    2:00 PM 03/07/2021   11:22 AM 08/04/2012    8:13 PM  CMP  Glucose 70 - 99 mg/dL   87   BUN 6 - 23 mg/dL   8   Creatinine 7.82 - 1.10 mg/dL   9.56   Sodium 213 - 086 mEq/L   137   Potassium 3.5 - 5.1 mEq/L   4.1   Chloride 96 - 112 mEq/L   103   CO2 19 - 32 mEq/L   27   Calcium 8.4 - 10.5 mg/dL   8.8   Total Protein 6.1 - 8.1 g/dL 6.6  6.7    Total Bilirubin 0.2 - 1.2 mg/dL 0.3  0.4    AST 10 - 35 U/L 19  17    ALT 6 - 29 U/L 9  6      CT CHEST LUNG CA SCREEN LOW DOSE W/O CM CLINICAL DATA:  Former smoker. Fifty-two pack-year smoking history. Asymptomatic.   EXAM: CT CHEST WITHOUT CONTRAST LOW-DOSE FOR LUNG CANCER SCREENING   TECHNIQUE: Multidetector CT imaging of the chest was performed following the standard protocol without IV contrast.   RADIATION DOSE REDUCTION: This exam was performed according to the departmental dose-optimization program which includes automated exposure control, adjustment of the mA and/or kV according to patient size and/or use of iterative reconstruction technique.  COMPARISON:  09/06/2021   FINDINGS: Cardiovascular: Heart size is normal. Aortic atherosclerosis. Coronary artery calcifications noted.   Mediastinum/Nodes: No enlarged mediastinal, hilar, or axillary lymph nodes. Thyroid gland, trachea, and esophagus demonstrate no significant findings.   Lungs/Pleura: No pleural fluid or airspace disease. Scarring noted within the lung bases. Mild centrilobular emphysema. Calcified granulomas are again noted. Several small solid lung nodules are identified the largest nodule is in the apical segment of the left upper lobe with a mean derived diameter of 3.3 mm. These are unchanged from prior exam.   Upper Abdomen: No acute abnormality.  Status post cholecystectomy.   Musculoskeletal: No chest wall mass or suspicious bone lesions identified.   IMPRESSION: 1. Lung-RADS 2, benign appearance or behavior. Continue  annual screening with low-dose chest CT without contrast in 12 months. 2. Coronary artery calcifications. 3.  Aortic Atherosclerosis (ICD10-I70.0).   Electronically Signed   By: Kimberley Penman M.D.   On: 09/07/2022 15:37     GI PROCEDURES:   EGD 12/20/2017: - No endoscopic esophageal abnormality to explain patient's dysphagia. Esophagus dilated. Dilated.  - Erythematous mucosa in the stomach. Biopsied.  - Normal duodenal bulb and second portion of the duodenum. Surgical [P], gastric antrum and body - GASTRIC ANTRAL AND OXYNTIC MUCOSA WITH NO SPECIFIC HISTOPATHOLOGIC CHANGES. - WARTHIN-STARRY STAIN IS NEGATIVE FOR HELICOBACTER PYLORI.  Colonoscopy 01/03/2020: - Thirteen 5 to 8 mm polyps in the rectum, in the sigmoid colon, in the descending colon, in the transverse colon and in the cecum, removed with a cold snare. Resected and retrieved.  - Internal hemorrhoids.  - The examination was otherwise normal on direct and retroflexion views - 1 year recall colonoscopy  Surgical [P], colon, cecum x 2, transverse x 1, descending x 4, sigmoid x 2, rectal x 4, polyp (13) - MULTIPLE FRAGMENTS OF SESSILE SERRATED POLYP(S) - NO HIGH GRADE DYSPLASIA OR MALIGNANCY IDENTIFIED  Colonoscopy 09/07/2021: - Five 6 to 7 mm polyps in the sigmoid colon and in the transverse colon, removed with a cold snare. Resected and retrieved. -  Internal hemorrhoids.  - The examination was otherwise normal on direct and retroflexion views. - 3 year recall colonoscopy Surgical [P], colon, transverse, sigmoid, polyp (5) - MULTIPLE FRAGMENTS OF SESSILE SERRATED POLYP(S) - NO HIGH GRADE DYSPLASIA OR MALIGNANCY IDENTIFIED  Past Medical History:  Diagnosis Date   Allergy    Anxiety    Arthritis    knee   Asthma    Blood transfusion without reported diagnosis    1991 after childbirth   Colon polyps    GERD (gastroesophageal reflux disease)    Hyperlipidemia    Thyroid disease    UTI (urinary tract infection)     Past Surgical History:  Procedure Laterality Date   APPENDECTOMY     BACK SURGERY     BACK SURGERY     CHOLECYSTECTOMY     COLONOSCOPY     GANGLION CYST EXCISION     right wrist 1992   HERNIA REPAIR     KNEE ARTHROCENTESIS     TUBAL LIGATION     Current Outpatient Medications on File Prior to Visit  Medication Sig Dispense Refill   albuterol  (VENTOLIN  HFA) 108 (90 Base) MCG/ACT inhaler Inhale 2 puffs into the lungs every 6 (six) hours as needed for wheezing or shortness of breath. 8 g 6   budesonide -formoterol  (SYMBICORT ) 80-4.5 MCG/ACT inhaler Inhale 1-2 puffs into the lungs 2 (two) times daily. Rinse after use. 3 each 4  cetirizine (ZYRTEC) 10 MG tablet Take 1 tablet by mouth daily.     fluticasone  (FLONASE) 50 MCG/ACT nasal spray Place into both nostrils daily.     levothyroxine (SYNTHROID) 100 MCG tablet Take by mouth daily before breakfast.     montelukast (SINGULAIR) 10 MG tablet Take 10 mg by mouth at bedtime.     rosuvastatin (CRESTOR) 20 MG tablet Take 1 tablet by mouth daily.     WEGOVY 2.4 MG/0.75ML SOAJ Inject 2.4 mg into the skin once a week.     No current facility-administered medications on file prior to visit.   No Known Allergies  Current Medications, Allergies, Past Medical History, Past Surgical History, Family History and Social History were reviewed in [employer] record.  Review of Systems:   Constitutional: Negative for fever, sweats, chills or weight loss.  Respiratory: Negative for shortness of breath.   Cardiovascular: Negative for chest pain, palpitations and leg swelling.  Gastrointestinal: See HPI.  Musculoskeletal: Negative for back pain or muscle aches.  Neurological: Negative for dizziness, headaches or paresthesias.   Physical Exam: LMP 06/04/2012  BP 94/60 (BP Location: Left Arm, Patient Position: Sitting, Cuff Size: Normal)   Pulse 76   Ht 5\' 3"  (1.6 m) Comment: height measured without shoes  Wt 156 lb 2 oz  (70.8 kg)   LMP 06/04/2012   BMI 27.66 kg/m   General: 53 year old female in no acute distress. Head: Normocephalic and atraumatic. Eyes: No scleral icterus. Conjunctiva pink . Ears: Normal auditory acuity. Mouth: Dentition intact. No ulcers or lesions.  Lungs: Clear throughout to auscultation. Heart: Regular rate and rhythm, no murmur. Abdomen: Soft, nontender and nondistended. No masses or hepatomegaly. Normal bowel sounds x 4 quadrants.  Rectal: Deferred.  Musculoskeletal: Symmetrical with no gross deformities. Extremities: No edema. Neurological: Alert oriented x 4. No focal deficits.  Psychological: Alert and cooperative. Normal mood and affect  Assessment and Recommendations:  53 year old female with GERD, stable on Omeprazole  40 mg twice daily. She experienced active reflux symptoms after taking Omeprazole  once daily 06/2023. EGD 12/20/2017 showed a normal esophagus which was empirically dilated due to having dysphagia symptoms and mild erythematous mucosa was noted in the stomach, biopsies were negative for H. pylori.  -Continue Omeprazole  40 mg twice daily to be taken 30 minutes before breakfast and dinner -Patient encouraged to reduce coffee intake -GERD diet discussed -EGD at time of colonoscopy 09/30/24 (mother deceased 08/31/2023 secondary to stomach and colon cancer, primary cancer unknown) -Patient is scheduled for routine labs with her PCP 12/2023, recommend checking a CBC, CMP and vitamin D level  History of colon polyps. Her most recent colonoscopy was 09/07/2021 and 5 sessile serrated polyps were removed from the sigmoid and transverse colon. Prior colonoscopy 01/03/2020 identified 13 sessile serrated polyps removed from the colon (mother deceased 08-31-2023 secondary to stomach and colon cancer, primary cancer unknown). -Next surveillance colonoscopy due 09-30-24

## 2023-12-03 NOTE — Patient Instructions (Addendum)
 We have sent the following medications to your pharmacy for you to pick up at your convenience:Omeprazole  40 mg  Have your CBC, CMP and Vitamin D level checked with your primary care provider in May 2025.  Next colonoscopy due February 2026  Due to recent changes in healthcare laws, you may see the results of your imaging and laboratory studies on MyChart before your provider has had a chance to review them.  We understand that in some cases there may be results that are confusing or concerning to you. Not all laboratory results come back in the same time frame and the provider may be waiting for multiple results in order to interpret others.  Please give us  48 hours in order for your provider to thoroughly review all the results before contacting the office for clarification of your results.   Thank you for trusting me with your gastrointestinal care!   Everett Hitt, CRNP

## 2024-06-09 ENCOUNTER — Ambulatory Visit

## 2024-06-09 ENCOUNTER — Encounter: Payer: Self-pay | Admitting: Podiatry

## 2024-06-09 ENCOUNTER — Ambulatory Visit: Admitting: Podiatry

## 2024-06-09 DIAGNOSIS — M21611 Bunion of right foot: Secondary | ICD-10-CM

## 2024-06-09 DIAGNOSIS — M7671 Peroneal tendinitis, right leg: Secondary | ICD-10-CM | POA: Diagnosis not present

## 2024-06-09 DIAGNOSIS — M21619 Bunion of unspecified foot: Secondary | ICD-10-CM

## 2024-06-09 MED ORDER — TRIAMCINOLONE ACETONIDE 10 MG/ML IJ SUSP
10.0000 mg | Freq: Once | INTRAMUSCULAR | Status: AC
  Administered 2024-06-09: 10 mg via INTRA_ARTICULAR

## 2024-06-09 NOTE — Progress Notes (Signed)
 Subjective:   Patient ID: Mikayla Richardson, female   DOB: 53 y.o.   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  Today I am focusing on the acute inflammation and I did do sterile prep and carefully injected the sheath of the tendon 3 mg dexamethasone Kenalog  5 mg Xylocaine at insertion and I applied a fascial brace from medial to lateral to lift up that side of the foot to take pressure off the bone structure.  Reappoint to recheck 2 weeks and to discuss bunion correction right  X-rays indicate elevation of the intermetatarsal angle right approximate 15 degrees with hypertrophy of the bone and no indication of pathology lateral side foot

## 2024-06-30 ENCOUNTER — Ambulatory Visit: Admitting: Podiatry

## 2024-07-07 ENCOUNTER — Ambulatory Visit: Admitting: Podiatry

## 2024-07-17 ENCOUNTER — Ambulatory Visit: Admitting: Podiatry

## 2024-07-17 ENCOUNTER — Encounter: Payer: Self-pay | Admitting: Podiatry

## 2024-07-17 DIAGNOSIS — M21619 Bunion of unspecified foot: Secondary | ICD-10-CM

## 2024-07-17 DIAGNOSIS — M7671 Peroneal tendinitis, right leg: Secondary | ICD-10-CM

## 2024-07-17 NOTE — Progress Notes (Signed)
 Subjective:   Patient ID: Mikayla Richardson, female   DOB: 53 y.o.   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  She understands she will need to be in a boot for 2 to 3 weeks a total recovery will take around 6 months and I made her aware of this.  For the peroneal we are gena offload and I dispensed air fracture walker to completely immobilize I instructed on ice therapy and we will use the boot during the postop recovery from bunion surgery

## 2024-08-21 ENCOUNTER — Encounter: Payer: Self-pay | Admitting: Adult Health

## 2024-08-21 ENCOUNTER — Ambulatory Visit: Admitting: Adult Health

## 2024-08-21 VITALS — BP 110/78 | HR 68 | Temp 98.1°F | Ht 64.0 in | Wt 154.8 lb

## 2024-08-21 DIAGNOSIS — Z87891 Personal history of nicotine dependence: Secondary | ICD-10-CM | POA: Diagnosis not present

## 2024-08-21 DIAGNOSIS — J45901 Unspecified asthma with (acute) exacerbation: Secondary | ICD-10-CM

## 2024-08-21 DIAGNOSIS — J019 Acute sinusitis, unspecified: Secondary | ICD-10-CM | POA: Diagnosis not present

## 2024-08-21 DIAGNOSIS — J4531 Mild persistent asthma with (acute) exacerbation: Secondary | ICD-10-CM

## 2024-08-21 DIAGNOSIS — J0100 Acute maxillary sinusitis, unspecified: Secondary | ICD-10-CM

## 2024-08-21 MED ORDER — PREDNISONE 10 MG PO TABS: ORAL_TABLET | ORAL | 0 refills | Status: AC

## 2024-08-21 MED ORDER — AZITHROMYCIN 250 MG PO TABS: ORAL_TABLET | ORAL | 0 refills | Status: AC

## 2024-08-21 NOTE — Patient Instructions (Addendum)
 Zpack take as directed  Prednisone  taper over next week.  Saline nasal rinses Twice daily   Continue on Zyrtec, Flonase and Singulair  Delsym 2 tsp Twice daily  As needed cough  Symbicort  2 puffs Twice daily, rinse after use.  Albuterol  inhaler or neb .As needed   Fluids and rest  CT chest next month as planned  Follow up in 1 year with Dr. Geronimo or Luretha Eberly NP and As needed   Please contact office for sooner follow up if symptoms do not improve or worsen or seek emergency care

## 2024-08-21 NOTE — Progress Notes (Signed)
 "  @Patient  ID: Mikayla Richardson, female    DOB: March 03, 1971, 54 y.o.   MRN: [id number]  Chief Complaint  Patient presents with   Acute Visit    Coughing/allergies    Referring provider: No ref. provider found  HPI: 54 year old female former smoker followed for Asthma, Emphysema, chronic cough and lung nodule Participates in Lung Cancer CT chest screening    TEST/EVENTS : Reviewed 08/21/2024  Pulmonary function testing done June 2019 showed normal lung function with an FEV1 at 103%, ratio 82, FVC 99%, positive bronchodilator response.,  DLCO 80%.   High-resolution CT chest January 11, 2018 showed no evidence of interstitial lung disease.  Solid 3 mm apical right upper lobe nodule.   CT chest March 05, 2020 showed biapical pleural-parenchymal scarring, mild emphysema, noncalcified pulmonary nodules measuring 4 mm or less.  Unchanged and consistent with benign etiology.   Lung cancer screening CT chest September 06, 2021 lung RADS 2 with benign appearance, diffuse bronchial wall thickening with emphysema, several small pulmonary nodules identified largest measuring 3.5 mm   Lung cancer screening CT chest September 06, 2022 showed a lung RADS 2 benign appearance.  Mild emphysema.  Several small solid lung nodules largest measuring 3.3 mm.  Unchanged from previous exam.   Spirometry 10/2021 -Normal    Discussed the use of AI scribe software for clinical note transcription with the patient, who gave verbal consent to proceed.  History of Present Illness Mikayla Richardson is a 54 year old female with asthma who presents with persistent respiratory symptoms following a recent flu infection.  In December, she contracted the flu, resulting in severe illness for two weeks with a significant cough and absence from work. She managed symptoms with home remedies and over-the-counter medications without seeking medical attention.  Currently, she experiences chest tightness and wheezing. Her cough has improved and is  now rare, but she feels she is not breathing fully. She uses Symbicort  twice daily and albuterol  for temporary relief, along with occasional nebulizer use.  She reports significant sinus drainage and a sensation of tasting blood due to a dry nose. She uses a humidifier at night and takes Zyrtec every morning along with Flonase. She has sinus pressure and congestion  and drainage.   She maintains an active lifestyle, exercising five days a week, and recently completed an eight-mile hike. She has been losing weight through her own efforts and is no longer using Wegovy. 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  She has a history of normal lung function tests in 2019 and 2023. She is scheduled for a lung cancer screening CT scan in February. No hemoptysis, vomiting, calf pain, or ankle swelling.     Allergies[1]  Immunization History  Administered Date(s) Administered   Influenza Split 05/24/2012, 05/07/2013, 06/19/2017   Influenza,inj,Quad PF,6+ Mos 05/14/2018, 08/13/2019, 05/24/2022   Influenza-Unspecified 05/24/2012, 05/07/2013, 06/19/2017   Moderna Covid-19 Fall Seasonal Vaccine 38yrs & older 05/24/2022, 06/04/2023, 05/22/2024   PFIZER(Purple Top)SARS-COV-2 Vaccination 11/02/2019, 11/23/2019, 05/26/2020   Pfizer Covid-19 Vaccine Bivalent Booster 65yrs & up 08/24/2021   Pneumococcal Polysaccharide-23 01/08/2013   Tdap 08/29/2019, 08/17/2021    Past Medical History:  Diagnosis Date   Allergy    Anxiety    Arthritis    knee   Asthma    Blood transfusion without reported diagnosis    1991 after childbirth   Colon polyps    GERD (gastroesophageal reflux disease)    Hyperlipidemia  Thyroid disease    UTI (urinary tract infection)     Tobacco History: Tobacco Use History[2] Counseling given: Not Answered Tobacco comments: Quit smoking as of 06/2020 05/02/2021 TH   Outpatient Medications Prior to Visit  Medication Sig  Dispense Refill   albuterol  (VENTOLIN  HFA) 108 (90 Base) MCG/ACT inhaler Inhale 2 puffs into the lungs every 6 (six) hours as needed for wheezing or shortness of breath. 8 g 6   budesonide -formoterol  (SYMBICORT ) 80-4.5 MCG/ACT inhaler Inhale 1-2 puffs into the lungs 2 (two) times daily. Rinse after use. 3 each 4   cetirizine (ZYRTEC) 10 MG tablet Take 1 tablet by mouth daily.     fluticasone  (FLONASE) 50 MCG/ACT nasal spray Place into both nostrils daily.     levothyroxine (SYNTHROID) 100 MCG tablet Take by mouth daily before breakfast.     montelukast (SINGULAIR) 10 MG tablet Take 10 mg by mouth at bedtime.     omeprazole  (PRILOSEC) 40 MG capsule Take 1 capsule (40 mg total) by mouth 2 (two) times daily. Take 30 minutes before breakfast and dinner. 180 capsule 3   rosuvastatin (CRESTOR) 20 MG tablet Take 1 tablet by mouth daily.     amoxicillin (AMOXIL) 500 MG tablet Take 1,000 mg by mouth 2 (two) times daily. (Patient not taking: Reported on 08/21/2024)     WEGOVY 2.4 MG/0.75ML SOAJ Inject 2.4 mg into the skin once a week. (Patient not taking: Reported on 08/21/2024)     No facility-administered medications prior to visit.     Review of Systems:   Constitutional:   No  weight loss, night sweats,  Fevers, chills, fatigue, or  lassitude.  HEENT:   No headaches,  Difficulty swallowing,  Tooth/dental problems, or  Sore throat,                No sneezing, itching, ear ache, +nasal congestion, post nasal drip,   CV:  No chest pain,  Orthopnea, PND, swelling in lower extremities, anasarca, dizziness, palpitations, syncope.   GI  No heartburn, indigestion, abdominal pain, nausea, vomiting, diarrhea, change in bowel habits, loss of appetite, bloody stools.   Resp: No shortness of breath with exertion or at rest.  No excess mucus, no productive cough,  No non-productive cough,  No coughing up of blood.  No change in color of mucus.  No wheezing.  No chest wall deformity  Skin: no rash or  lesions.  GU: no dysuria, change in color of urine, no urgency or frequency.  No flank pain, no hematuria   MS:  No joint pain or swelling.  No decreased range of motion.  No back pain.    Physical Exam  BP 110/78   Pulse 68   Temp 98.1 F (36.7 C)   Ht 5' 4 (1.626 m) Comment: Per pt  Wt 154 lb 12.8 oz (70.2 kg)   LMP 06/04/2012   SpO2 93% Comment: RA  BMI 26.57 kg/m   GEN: A/Ox3; pleasant , NAD, well nourished    HEENT:  [place]/AT,  NOSE-clear drainage, max sinus tenderness   THROAT-clear, no lesions, no postnasal drip or exudate noted.   NECK:  Supple w/ fair ROM; no JVD; normal carotid impulses w/o bruits; no thyromegaly or nodules palpated; no lymphadenopathy.    RESP  Clear  P & A; w/o, wheezes/ rales/ or rhonchi. no accessory muscle use, no dullness to percussion  CARD:  RRR, no m/r/g, no peripheral edema, pulses intact, no cyanosis or clubbing.  GI:   Soft &  nt; nml bowel sounds; no organomegaly or masses detected.   Musco: Warm bil, no deformities or joint swelling noted.   Neuro: alert, no focal deficits noted.    Skin: Warm, no lesions or rashes    Lab Results:Reviewed 08/21/2024   CBC    Component Value Date/Time   WBC 6.3 06/07/2021 1400   RBC 4.45 06/07/2021 1400   HGB 13.7 06/07/2021 1400   HCT 40.8 06/07/2021 1400   PLT 233 06/07/2021 1400   MCV 91.7 06/07/2021 1400   MCH 30.8 06/07/2021 1400   MCHC 33.6 06/07/2021 1400   RDW 14.0 06/07/2021 1400   LYMPHSABS 1,909 06/07/2021 1400   MONOABS 0.6 08/04/2012 2013   EOSABS 284 06/07/2021 1400   BASOSABS 69 06/07/2021 1400    BMET    Component Value Date/Time   NA 137 08/04/2012 2013   K 4.1 08/04/2012 2013   CL 103 08/04/2012 2013   CO2 27 08/04/2012 2013   GLUCOSE 87 08/04/2012 2013   BUN 8 08/04/2012 2013   CREATININE 0.75 08/04/2012 2013   CALCIUM 8.8 08/04/2012 2013   GFRNONAA >90 08/04/2012 2013   GFRAA >90 08/04/2012 2013    BNP No results found for: BNP  ProBNP No results  found for: PROBNP  Imaging: No results found.  Administration History     None          Latest Ref Rng & Units 01/23/2018    9:53 AM  PFT Results  FVC-Pre L 3.37   FVC-Predicted Pre % 93   FVC-Post L 3.60   FVC-Predicted Post % 99   Pre FEV1/FVC % % 78   Post FEV1/FCV % % 82   FEV1-Pre L 2.64   FEV1-Predicted Pre % 91   FEV1-Post L 2.96   DLCO uncorrected ml/min/mmHg 20.01   DLCO UNC% % 80   DLVA Predicted % 76   TLC L 5.93   TLC % Predicted % 115   RV % Predicted % 149     Lab Results  Component Value Date   NITRICOXIDE 18 01/02/2018        No data to display              Assessment & Plan:   Assessment and Plan Assessment & Plan Asthma with acute exacerbation  -ongoing  The asthma exacerbation is likely triggered by a recent influenza infection, presenting with chest tightness and wheezing. There is no significant cough or sputum production. Albuterol  provides temporary relief. Lung function tests from 2019 and 2023 were normal, and hold chest x-ray today as a CT scan is scheduled in 2-3 weeks. Continue Symbicort , two puffs twice daily, and use albuterol  as needed for symptom relief. Use a nebulizer once daily, preferably in the morning, for the next three to four days. Prescribe prednisone   taper over next week. Ensure adequate hydration and rest.  Acute sinusitis  -ongoing  Significant sinus drainage and soreness are likely contributing to the asthma exacerbation.  . Prescribe Z-Pak (azithromycin ): two tablets on the first day, followed by one tablet daily for five days. Advise taking Z-Pak with food to prevent gastrointestinal upset. Recommend daily nasal rinses with saline solution. Continue Zyrtec, Flonase, and Singulair. Encourage warm compresses, fluids, and rest.  Tobacco abuse hx -continue with yearly CT chest screening   Plan  Patient Instructions  Zpack take as directed  Prednisone  taper over next week.  Saline nasal rinses Twice daily    Continue on Zyrtec, Flonase and Singulair  Delsym  2 tsp Twice daily  As needed cough  Symbicort  2 puffs Twice daily, rinse after use.  Albuterol  inhaler or neb .As needed   Fluids and rest  CT chest next month as planned  Follow up in 1 year with Dr. Geronimo or Barnet Benavides NP and As needed   Please contact office for sooner follow up if symptoms do not improve or worsen or seek emergency care             Jovanny Stephanie, NP 08/21/2024       [1] No Known Allergies [2]  Social History Tobacco Use  Smoking Status Former   Current packs/day: 0.00   Average packs/day: 1 pack/day for 15.0 years (15.0 ttl pk-yrs)   Types: Cigarettes   Start date: 06/16/2005   Quit date: 06/16/2020   Years since quitting: 4.1  Smokeless Tobacco Never  Tobacco Comments   Quit smoking as of 06/2020   05/02/2021 TH   "

## 2024-09-12 ENCOUNTER — Inpatient Hospital Stay: Admission: RE | Admit: 2024-09-12 | Source: Ambulatory Visit

## 2024-09-12 ENCOUNTER — Encounter: Payer: Self-pay | Admitting: Internal Medicine

## 2024-09-12 DIAGNOSIS — Z122 Encounter for screening for malignant neoplasm of respiratory organs: Secondary | ICD-10-CM

## 2024-09-12 DIAGNOSIS — Z87891 Personal history of nicotine dependence: Secondary | ICD-10-CM

## 2024-09-19 ENCOUNTER — Encounter

## 2024-10-03 ENCOUNTER — Encounter: Admitting: Internal Medicine

## 2025-03-02 ENCOUNTER — Institutional Professional Consult (permissible substitution): Admitting: Podiatry
